# Patient Record
Sex: Female | Born: 1945
Health system: Southern US, Community
[De-identification: ages and names within clinical notes are randomized; demographics above are authoritative.]

## PROBLEM LIST (undated history)

## (undated) DIAGNOSIS — Z8619 Personal history of other infectious and parasitic diseases: Secondary | ICD-10-CM

## (undated) DIAGNOSIS — I5020 Unspecified systolic (congestive) heart failure: Secondary | ICD-10-CM

## (undated) DIAGNOSIS — M1990 Unspecified osteoarthritis, unspecified site: Secondary | ICD-10-CM

## (undated) DIAGNOSIS — M8580 Other specified disorders of bone density and structure, unspecified site: Secondary | ICD-10-CM

## (undated) DIAGNOSIS — Z8759 Personal history of other complications of pregnancy, childbirth and the puerperium: Secondary | ICD-10-CM

## (undated) DIAGNOSIS — L0100 Impetigo, unspecified: Secondary | ICD-10-CM

## (undated) DIAGNOSIS — R030 Elevated blood-pressure reading, without diagnosis of hypertension: Secondary | ICD-10-CM

## (undated) HISTORY — DX: Impetigo, unspecified: L01.00

## (undated) HISTORY — DX: Personal history of other infectious and parasitic diseases: Z86.19

## (undated) HISTORY — DX: Elevated blood-pressure reading, without diagnosis of hypertension: R03.0

## (undated) HISTORY — DX: Unspecified osteoarthritis, unspecified site: M19.90

## (undated) HISTORY — DX: Unspecified systolic (congestive) heart failure: I50.20

## (undated) HISTORY — DX: Personal history of other complications of pregnancy, childbirth and the puerperium: Z87.59

## (undated) HISTORY — DX: Other specified disorders of bone density and structure, unspecified site: M85.80

---
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
## 2013-03-28 ENCOUNTER — Encounter: Payer: Self-pay | Admitting: Obstetrics and Gynecology

## 2013-04-12 ENCOUNTER — Encounter: Payer: Self-pay | Admitting: Obstetrics and Gynecology

## 2013-04-15 ENCOUNTER — Encounter: Payer: Self-pay | Admitting: Gynecology

## 2014-05-30 ENCOUNTER — Encounter: Payer: Self-pay | Admitting: Certified Nurse Midwife

## 2014-07-14 ENCOUNTER — Encounter: Payer: Self-pay | Admitting: Nurse Practitioner

## 2014-11-21 ENCOUNTER — Ambulatory Visit (INDEPENDENT_AMBULATORY_CARE_PROVIDER_SITE_OTHER): Payer: 59 | Admitting: Nurse Practitioner

## 2014-11-21 ENCOUNTER — Encounter: Payer: Self-pay | Admitting: Nurse Practitioner

## 2014-11-21 VITALS — BP 160/92 | HR 88 | Temp 98.0°F | Ht 64.0 in | Wt 183.0 lb

## 2014-11-21 DIAGNOSIS — Z1211 Encounter for screening for malignant neoplasm of colon: Secondary | ICD-10-CM

## 2014-11-21 DIAGNOSIS — IMO0001 Reserved for inherently not codable concepts without codable children: Secondary | ICD-10-CM

## 2014-11-21 DIAGNOSIS — Z1239 Encounter for other screening for malignant neoplasm of breast: Secondary | ICD-10-CM

## 2014-11-21 DIAGNOSIS — Z23 Encounter for immunization: Secondary | ICD-10-CM

## 2014-11-21 DIAGNOSIS — L0100 Impetigo, unspecified: Secondary | ICD-10-CM

## 2014-11-21 DIAGNOSIS — R030 Elevated blood-pressure reading, without diagnosis of hypertension: Secondary | ICD-10-CM

## 2014-11-21 MED ORDER — AMOXICILLIN-POT CLAVULANATE 875-125 MG PO TABS: 1.0000 | ORAL_TABLET | Freq: Two times a day (BID) | ORAL | Status: DC

## 2014-11-21 MED ORDER — MUPIROCIN 2 % EX OINT: 1.0000 "application " | TOPICAL_OINTMENT | Freq: Two times a day (BID) | CUTANEOUS | Status: DC

## 2014-11-21 NOTE — Patient Instructions (Signed)
Start oral antibiotic. Take after eating. Eat yogurt daily to help prevent antibiotic -associated diarrhea. Use mupirocin cream twice daily over skin lesions. Wash with [employer] twice daily.  Apply warm wash cloth (as hot as you can stand) 3 times daily. Return in 1 week to evaluate response. We can do Pap at same time if convenient.      Cellulitis Cellulitis is an infection of the skin and the tissue beneath it. The infected area is usually red and tender. Cellulitis occurs most often in the arms and lower legs.  CAUSES  Cellulitis is caused by bacteria that enter the skin through cracks or cuts in the skin. The most common types of bacteria that cause cellulitis are staphylococci and streptococci. SIGNS AND SYMPTOMS   Redness and warmth.  Swelling.  Tenderness or pain.  Fever. DIAGNOSIS  Your health care provider can usually determine what is wrong based on a physical exam. Blood tests may also be done. TREATMENT  Treatment usually involves taking an antibiotic medicine. HOME CARE INSTRUCTIONS   Take your antibiotic medicine as directed by your health care provider. Finish the antibiotic even if you start to feel better.  Keep the infected arm or leg elevated to reduce swelling.  Apply a warm cloth to the affected area up to 4 times per day to relieve pain.  Take medicines only as directed by your health care provider.  Keep all follow-up visits as directed by your health care provider. SEEK MEDICAL CARE IF:   You notice red streaks coming from the infected area.  Your red area gets larger or turns dark in color.  Your bone or joint underneath the infected area becomes painful after the skin has healed.  Your infection returns in the same area or another area.  You notice a swollen bump in the infected area.  You develop new symptoms.  You have a fever. SEEK IMMEDIATE MEDICAL CARE IF:   You feel very sleepy.  You develop vomiting or diarrhea.  You have a  general ill feeling (malaise) with muscle aches and pains. MAKE SURE YOU:   Understand these instructions.  Will watch your condition.  Will get help right away if you are not doing well or get worse. Document Released: 06/11/2005 Document Revised: 01/16/2014 Document Reviewed: 11/17/2011 [facility] Patient Information 2015 [place], [place]. This information is not intended to replace advice given to you by your health care provider. Make sure you discuss any questions you have with your health care provider.

## 2014-11-21 NOTE — Progress Notes (Signed)
Pre visit review using our clinic review tool, if applicable. No additional management support is needed unless otherwise documented below in the visit note. 

## 2014-11-27 ENCOUNTER — Encounter: Payer: Self-pay | Admitting: Nurse Practitioner

## 2014-11-27 DIAGNOSIS — I10 Essential (primary) hypertension: Secondary | ICD-10-CM | POA: Insufficient documentation

## 2014-11-27 DIAGNOSIS — IMO0001 Reserved for inherently not codable concepts without codable children: Secondary | ICD-10-CM

## 2014-11-27 DIAGNOSIS — R030 Elevated blood-pressure reading, without diagnosis of hypertension: Secondary | ICD-10-CM

## 2014-11-27 DIAGNOSIS — L0100 Impetigo, unspecified: Secondary | ICD-10-CM | POA: Insufficient documentation

## 2014-11-27 DIAGNOSIS — Z1239 Encounter for other screening for malignant neoplasm of breast: Secondary | ICD-10-CM | POA: Insufficient documentation

## 2014-11-27 DIAGNOSIS — Z1211 Encounter for screening for malignant neoplasm of colon: Secondary | ICD-10-CM | POA: Insufficient documentation

## 2014-11-27 HISTORY — DX: Reserved for inherently not codable concepts without codable children: IMO0001

## 2014-11-27 HISTORY — DX: Impetigo, unspecified: L01.00

## 2014-11-27 NOTE — Progress Notes (Signed)
Subjective:     Erica Liu is a 69 y.o. female and is here for a comprehensive physical exam. She is accompanied by her daughter today. The patient reports problems - arthritis in knees, abrasion on face, and has elevated blood pressure on exam today.  Also c/o fatigue. Knees: duration-many months. Pain worse w/stairs. Pain improved since taking turmeric. Denies warmth, swelling, erythema. Face: onset-1 week ago. Incident-"scrubbed" skin over brow to get make-up off. Few days later, area became red, tender. Now has swelling of face & spreading rash. Denies fever, drainage of wound. BP: No Hx of high bp. RF: age, obese. 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 Fatigue: Comes home from work exhausted. Denies fever, chills, nightsweats, frequent infections. No PCP in " years" . [facility]. Recalls Pap was nml, no hX of abnmls. Went through menopause 10 yrs ago. Last MMG 5 to 10 yrs ago-nml.  History   Social History  . Marital Status: Married    Spouse Name: N/A  . Number of Children: 4  . Years of Education: N/A   Occupational History  .      teacher asst   Social History Main Topics  . Smoking status: Former Smoker -- 0.50 packs/day for 15 years    Quit date: 11/20/1988  . Smokeless tobacco: Not on file  . Alcohol Use: 0.0 oz/week    0 Standard drinks or equivalent per week     Comment: occasional  . Drug Use: No  . Sexual Activity: Yes    Birth Control/ Protection: Post-menopausal   Other Topics Concern  . Not on file   Social History Narrative   [name] is from [place]. She is divorced & remarried. Lives w/her husband, 2 grandsons, & daughter. 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 DEXA SCAN  05/30/2015 (Originally 02/04/2012)  . ZOSTAVAX  05/30/2015 (Originally 02/04/2007)  . COLONOSCOPY  11/27/2015 (Originally 02/03/1997)  . INFLUENZA VACCINE  04/16/2015  .  PNA vac Low Risk Adult (2 of 2 - PCV13) 11/21/2015  . MAMMOGRAM  11/26/2016  . TETANUS/TDAP  11/20/2024    The following portions of the patient's history were reviewed and updated as appropriate: allergies, current medications, past family history, past medical history, past social history, past surgical history and problem list.  Review of Systems Eyes: negative for irritation and visual disturbance Respiratory: negative for cough and dyspnea on exertion Cardiovascular: negative for lower extremity edema and palpitations Gastrointestinal: negative for abdominal pain, change in bowel habits, constipation, diarrhea and reflux symptoms Neurological: negative for headaches Behavioral/Psych: negative for anxiety, bad mood, excessive alcohol consumption, illegal drug usage, sleep disturbance and tobacco use   Objective:    BP 160/92 mmHg  Pulse 88  Temp(Src) 98 F (36.7 C) (Temporal)  Ht  (1.626 m)  Wt 183 lb (83.008 kg)  BMI 31.40 kg/m2  SpO2 98% General appearance: alert, cooperative, appears stated age and no distress Head: Normocephalic, without obvious abnormality, atraumatic Eyes: negative findings: lids and lashes normal, conjunctivae and sclerae normal, corneas clear and pupils equal, round, reactive to light and accomodation Ears: normal TM's and external ear canals both ears Neck: no adenopathy, no carotid bruit, supple, symmetrical, trachea midline and thyroid not enlarged, symmetric, no tenderness/mass/nodules Lungs: clear to auscultation bilaterally Heart: regular rate and rhythm, S1, S2 normal, no murmur, click, rub or gallop Abdomen: soft, non-tender; bowel sounds normal; no  masses,  no organomegaly Extremities: extremities normal, atraumatic, no cyanosis or edema Pulses: 2+ and symmetric Skin: Mild periorbital edema R eye, yellow exudate in brow, abrasion & erythema along brow & adjcent to brow-lat face. Tender to palpate Lymph nodes: Cervical adenopathy: none,  Supraclavicular adenopathy: none and no preauricular or postauricular LAD Neurologic: Grossly normal    Assessment:Plan  1. Breast cancer screening - MM DIGITAL SCREENING BILATERAL; Future  2. Colon cancer screening Pt declines colonoscopy - Fecal occult blood, imunochemical  3. Impetigo, new See pt instructions for skin care - mupirocin ointment (BACTROBAN) 2 %; Place 1 application into the nose 2 (two) times daily.  Dispense: 22 g; Refill: 0 - amoxicillin-clavulanate (AUGMENTIN) 875-125 MG per tablet; Take 1 tablet by mouth 2 (two) times daily.  Dispense: 20 tablet; Refill: 0  4. Need for 23-polyvalent pneumococcal polysaccharide vaccine - Pneumococcal polysaccharide vaccine 23-valent greater than or equal to 2yo subcutaneous/IM  5. Need for Tdap vaccination - Tdap vaccine greater than or equal to 7yo IM  6. Elevated blood pressure, new Weight loss Exercise  F/u 1 weeks-impetigo, CPE, PAP

## 2014-11-29 ENCOUNTER — Telehealth: Payer: Self-pay | Admitting: Nurse Practitioner

## 2014-11-29 ENCOUNTER — Ambulatory Visit: Payer: 59 | Admitting: Nurse Practitioner

## 2014-11-29 NOTE — Telephone Encounter (Signed)
Patient is feeling better. She would like to get a shingles vaccine. Can a Rx be mailed to her house?

## 2014-11-29 NOTE — Telephone Encounter (Signed)
Please Advise

## 2014-11-29 NOTE — Telephone Encounter (Signed)
Yes. I will write script. Please mail. pls call her & advise she can take [facility] to [facility]. She should be well when she gets vaccine-no sniffles or fever.

## 2014-11-30 ENCOUNTER — Ambulatory Visit: Payer: 59 | Admitting: Nurse Practitioner

## 2014-11-30 NOTE — Telephone Encounter (Signed)
LMOVM for patient to return call.

## 2014-11-30 NOTE — Telephone Encounter (Signed)
Patient called back and I informed her of Erica Liu's message.

## 2014-12-07 ENCOUNTER — Ambulatory Visit: Payer: 59 | Admitting: Nurse Practitioner

## 2014-12-07 ENCOUNTER — Ambulatory Visit: Payer: 59 | Admitting: Psychology

## 2014-12-08 ENCOUNTER — Ambulatory Visit (HOSPITAL_BASED_OUTPATIENT_CLINIC_OR_DEPARTMENT_OTHER)
Admission: RE | Admit: 2014-12-08 | Discharge: 2014-12-08 | Disposition: A | Discharge status: Home / Self Care | Payer: 59 | Source: Ambulatory Visit | Attending: Nurse Practitioner | Admitting: Nurse Practitioner

## 2014-12-08 DIAGNOSIS — Z1231 Encounter for screening mammogram for malignant neoplasm of breast: Secondary | ICD-10-CM | POA: Insufficient documentation

## 2014-12-08 DIAGNOSIS — Z1239 Encounter for other screening for malignant neoplasm of breast: Secondary | ICD-10-CM

## 2014-12-15 ENCOUNTER — Ambulatory Visit (INDEPENDENT_AMBULATORY_CARE_PROVIDER_SITE_OTHER): Payer: PRIVATE HEALTH INSURANCE | Admitting: Psychology

## 2014-12-15 ENCOUNTER — Encounter: Payer: 59 | Admitting: Nurse Practitioner

## 2014-12-15 DIAGNOSIS — F40228 Other natural environment type phobia: Secondary | ICD-10-CM

## 2014-12-15 DIAGNOSIS — F40248 Other situational type phobia: Secondary | ICD-10-CM | POA: Diagnosis not present

## 2014-12-21 ENCOUNTER — Ambulatory Visit (INDEPENDENT_AMBULATORY_CARE_PROVIDER_SITE_OTHER): Payer: PRIVATE HEALTH INSURANCE | Admitting: Psychology

## 2014-12-21 DIAGNOSIS — F40228 Other natural environment type phobia: Secondary | ICD-10-CM | POA: Diagnosis not present

## 2014-12-21 DIAGNOSIS — F40248 Other situational type phobia: Secondary | ICD-10-CM | POA: Diagnosis not present

## 2014-12-28 ENCOUNTER — Ambulatory Visit: Payer: 59 | Admitting: Psychology

## 2015-01-04 ENCOUNTER — Ambulatory Visit: Payer: PRIVATE HEALTH INSURANCE | Admitting: Psychology

## 2015-01-04 ENCOUNTER — Ambulatory Visit (INDEPENDENT_AMBULATORY_CARE_PROVIDER_SITE_OTHER): Payer: PRIVATE HEALTH INSURANCE | Admitting: Psychology

## 2015-01-04 DIAGNOSIS — F40248 Other situational type phobia: Secondary | ICD-10-CM | POA: Diagnosis not present

## 2015-01-04 DIAGNOSIS — F40228 Other natural environment type phobia: Secondary | ICD-10-CM | POA: Diagnosis not present

## 2015-01-05 ENCOUNTER — Encounter: Payer: 59 | Admitting: Nurse Practitioner

## 2015-01-17 ENCOUNTER — Ambulatory Visit: Payer: 59 | Admitting: Psychology

## 2016-08-06 ENCOUNTER — Telehealth: Payer: Self-pay | Admitting: *Deleted

## 2016-08-06 NOTE — Telephone Encounter (Signed)
Left message for pt to call back. So we can update her chart. New PCP? Or is she planing on seeing Dr. McGowen or Dr. Kuneff.   

## 2016-08-15 NOTE — Telephone Encounter (Signed)
Left message for pt to call back  °

## 2016-08-15 NOTE — Telephone Encounter (Signed)
Spoke to pt and she stated that she is going to call after the holidays and schedule an apt with Dr. Claiborne Billings.

## 2018-05-31 ENCOUNTER — Ambulatory Visit (INDEPENDENT_AMBULATORY_CARE_PROVIDER_SITE_OTHER): Payer: 59

## 2018-05-31 ENCOUNTER — Encounter: Payer: Self-pay | Admitting: Family Medicine

## 2018-05-31 ENCOUNTER — Ambulatory Visit (INDEPENDENT_AMBULATORY_CARE_PROVIDER_SITE_OTHER): Payer: 59 | Admitting: Family Medicine

## 2018-05-31 VITALS — BP 132/78 | HR 80 | Temp 97.8°F | Ht 65.5 in | Wt 172.8 lb

## 2018-05-31 DIAGNOSIS — Z0001 Encounter for general adult medical examination with abnormal findings: Secondary | ICD-10-CM | POA: Diagnosis not present

## 2018-05-31 DIAGNOSIS — M25512 Pain in left shoulder: Secondary | ICD-10-CM

## 2018-05-31 DIAGNOSIS — Z23 Encounter for immunization: Secondary | ICD-10-CM | POA: Diagnosis not present

## 2018-05-31 DIAGNOSIS — G8929 Other chronic pain: Secondary | ICD-10-CM

## 2018-05-31 DIAGNOSIS — L719 Rosacea, unspecified: Secondary | ICD-10-CM

## 2018-05-31 DIAGNOSIS — J339 Nasal polyp, unspecified: Secondary | ICD-10-CM | POA: Diagnosis not present

## 2018-05-31 DIAGNOSIS — M25561 Pain in right knee: Secondary | ICD-10-CM

## 2018-05-31 DIAGNOSIS — R011 Cardiac murmur, unspecified: Secondary | ICD-10-CM

## 2018-05-31 DIAGNOSIS — Z0000 Encounter for general adult medical examination without abnormal findings: Secondary | ICD-10-CM

## 2018-05-31 DIAGNOSIS — M25562 Pain in left knee: Secondary | ICD-10-CM | POA: Diagnosis not present

## 2018-05-31 DIAGNOSIS — N959 Unspecified menopausal and perimenopausal disorder: Secondary | ICD-10-CM

## 2018-05-31 DIAGNOSIS — Z1211 Encounter for screening for malignant neoplasm of colon: Secondary | ICD-10-CM

## 2018-05-31 DIAGNOSIS — Z1159 Encounter for screening for other viral diseases: Secondary | ICD-10-CM

## 2018-05-31 LAB — CBC WITH DIFFERENTIAL/PLATELET
BASOS ABS: 0.1 10*3/uL (ref 0.0–0.1)
Basophils Relative: 0.9 % (ref 0.0–3.0)
Eosinophils Absolute: 0.4 10*3/uL (ref 0.0–0.7)
Eosinophils Relative: 5.5 % — ABNORMAL HIGH (ref 0.0–5.0)
HCT: 39.2 % (ref 36.0–46.0)
Hemoglobin: 13.2 g/dL (ref 12.0–15.0)
LYMPHS ABS: 3 10*3/uL (ref 0.7–4.0)
Lymphocytes Relative: 40.8 % (ref 12.0–46.0)
MCHC: 33.6 g/dL (ref 30.0–36.0)
MCV: 90.7 fl (ref 78.0–100.0)
MONO ABS: 0.5 10*3/uL (ref 0.1–1.0)
Monocytes Relative: 7.5 % (ref 3.0–12.0)
NEUTROS PCT: 45.3 % (ref 43.0–77.0)
Neutro Abs: 3.3 10*3/uL (ref 1.4–7.7)
Platelets: 282 10*3/uL (ref 150.0–400.0)
RBC: 4.32 Mil/uL (ref 3.87–5.11)
RDW: 13 % (ref 11.5–15.5)
WBC: 7.3 10*3/uL (ref 4.0–10.5)

## 2018-05-31 LAB — LIPID PANEL
Cholesterol: 240 mg/dL — ABNORMAL HIGH (ref 0–200)
HDL: 55.6 mg/dL (ref >39.00)
LDL Cholesterol: 159 mg/dL — ABNORMAL HIGH (ref 0–99)
NonHDL: 184.4
Total CHOL/HDL Ratio: 4
Triglycerides: 129 mg/dL (ref 0.0–149.0)
VLDL: 25.8 mg/dL (ref 0.0–40.0)

## 2018-05-31 LAB — COMPREHENSIVE METABOLIC PANEL
ALK PHOS: 74 U/L (ref 39–117)
ALT: 17 U/L (ref 0–35)
AST: 22 U/L (ref 0–37)
Albumin: 4.5 g/dL (ref 3.5–5.2)
BILIRUBIN TOTAL: 0.6 mg/dL (ref 0.2–1.2)
BUN: 14 mg/dL (ref 6–23)
CO2: 30 mEq/L (ref 19–32)
Calcium: 9.6 mg/dL (ref 8.4–10.5)
Chloride: 105 mEq/L (ref 96–112)
Creatinine, Ser: 0.75 mg/dL (ref 0.40–1.20)
GFR: 80.89 mL/min (ref >60.00)
Glucose, Bld: 92 mg/dL (ref 70–99)
Potassium: 4.2 mEq/L (ref 3.5–5.1)
SODIUM: 140 meq/L (ref 135–145)
TOTAL PROTEIN: 7.3 g/dL (ref 6.0–8.3)

## 2018-05-31 LAB — TSH: TSH: 2.32 u[IU]/mL (ref 0.35–4.50)

## 2018-05-31 MED ORDER — MOMETASONE FUROATE 50 MCG/ACT NA SUSP: 2.0000 | Freq: Every day | NASAL | 12 refills | Status: DC

## 2018-05-31 MED ORDER — AZELAIC ACID 15 % EX GEL: CUTANEOUS | 1 refills | Status: DC

## 2018-05-31 MED ORDER — DICLOFENAC SODIUM 1 % TD GEL: TRANSDERMAL | 3 refills | Status: DC

## 2018-05-31 MED ORDER — CYCLOBENZAPRINE HCL 10 MG PO TABS: 10.0000 mg | ORAL_TABLET | Freq: Three times a day (TID) | ORAL | 0 refills | Status: DC | PRN

## 2018-05-31 NOTE — Progress Notes (Deleted)
Phone: [phone]  Subjective:  Patient presents today for their Medicare Exam    Preventive Screening-Counseling & Management   No exam data present  Advanced directives: ***  Smoking Status: ***Never Smoker Second Hand Smoking status: ***No smokers in home  Risk Factors Regular exercise: *** Diet: ***  Fall Risk: ***None  Fall Risk  05/31/2018 05/31/2018  Falls in the past year? No No   Opioid use history: *** no long term opioids use  Cardiac risk factors:  advanced age (older than 86 for men, 23 for women) *** Hyperlipidemia *** No diabetes. *** Family History: ***   Depression Screen None. PHQ2 0 *** Depression screen PHQ 2/9 05/31/2018  Decreased Interest 0  Down, Depressed, Hopeless 0  PHQ - 2 Score 0    Activities of Daily Living Independent ADLs and IADLs ***  Hearing Difficulties: ***-patient declines  Cognitive Testing No reported trouble.  ***  Normal 3 word recall  List the Names of Other Physician/Practitioners you currently use: -*** -***  Immunization History  Administered Date(s) Administered  . Pneumococcal Polysaccharide-23 11/21/2014  . Tdap 11/21/2014   Required Immunizations needed today ***  Screening tests- up to date Health Maintenance Due  Topic Date Due  . Hepatitis C Screening  03-Apr-1946  . COLONOSCOPY  02/03/1997  . DEXA SCAN  02/04/2012  . PNA vac Low Risk Adult (2 of 2 - PCV13) 11/21/2015  . MAMMOGRAM  12/07/2016  . INFLUENZA VACCINE  04/15/2018    ROS- No pertinent positives discovered in course of AWV  The following were reviewed and entered/updated in epic: Past Medical History:  Diagnosis Date  . Arthritis    knee?  . H/O miscarriage, not currently pregnant    2   Patient Active Problem List   Diagnosis Date Noted  . Breast cancer screening 11/27/2014  . Colon cancer screening 11/27/2014  . Impetigo 11/27/2014  . Elevated blood pressure 11/27/2014   Past Surgical History:  Procedure  Laterality Date  . TONSILLECTOMY  1953    Family History  Problem Relation Age of Onset  . Heart disease Mother 102  . Hypertension Mother   . Heart disease Father        mi  . Diabetes Sister   . Arthritis Daughter        RA  . Alcohol abuse Son     Medications- reviewed and updated No current outpatient medications on file.   No current facility-administered medications for this visit.     Allergies-reviewed and updated Allergies  Allergen Reactions  . Neomycin Anaphylaxis    Social History   Socioeconomic History  . Marital status: Married    Spouse name: Not on file  . Number of children: 4  . Years of education: Not on file  . Highest education level: Not on file  Occupational History    Comment: teacher asst  Social Needs  . Financial resource strain: Not on file  . Food insecurity:    Worry: Not on file    Inability: Not on file  . Transportation needs:    Medical: Not on file    Non-medical: Not on file  Tobacco Use  . Smoking status: Former Smoker    Packs/day: 0.50    Years: 15.00    Pack years: 7.50    Last attempt to quit: 11/20/1988    Years since quitting: 29.5  . Smokeless tobacco: Never Used  Substance and Sexual Activity  . Alcohol use: Yes    Alcohol/week: 0.0  standard drinks    Comment: occasional  . Drug use: No  . Sexual activity: Yes    Birth control/protection: Post-menopausal  Lifestyle  . Physical activity:    Days per week: Not on file    Minutes per session: Not on file  . Stress: Not on file  Relationships  . Social connections:    Talks on phone: Not on file    Gets together: Not on file    Attends religious service: Not on file    Active member of club or organization: Not on file    Attends meetings of clubs or organizations: Not on file    Relationship status: Not on file  Other Topics Concern  . Not on file  Social History Narrative   Ms. Ellery is from [place]. She is divorced & remarried. Lives w/her husband, 2  grandsons, & daughter. 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 Oropharynx normal Neck: no thyromegaly CV: RRR no murmurs rubs or gallops Lungs: CTAB no crackles, wheeze, rhonchi Abdomen: soft/nontender/nondistended/normal bowel sounds. No rebound or guarding.  Ext: no edema Skin: warm, dry Neuro: grossly normal, moves all extremities, PERRLA  Assessment/Plan:  Welcome to Medicare exam completed- discussed recommended screenings anddocumented any personalized health advice and referrals for preventive counseling. See AVS as well which was given to patient.   Status of chronic or acute concerns  ***  No problem-specific Assessment & Plan notes found for this encounter.   No future appointments. No follow-ups on file.   Lab/Order associations: Encounter for hepatitis C screening test for low risk patient - Plan: Hepatitis C antibody  Annual physical exam - Plan: CBC with Differential/Platelet, Comprehensive metabolic panel, Lipid panel, TSH  No orders of the defined types were placed in this encounter.   Return precautions advised. Orland Mustard, MD

## 2018-05-31 NOTE — Patient Instructions (Signed)
Health Maintenance Due  Topic Date Due  . Hepatitis C Screening -ordered today 04-Dec-1945  . COLONOSCOPY -pt refuses 02/03/1997  . DEXA SCAN -referral placed today 02/04/2012  . PNA vac Low Risk Adult (2 of 2 - PCV13)-done today 11/21/2015  . MAMMOGRAM -referral placed today 12/07/2016  . INFLUENZA VACCINE -please call our office to schedule this in October/November 04/15/2018

## 2018-05-31 NOTE — Progress Notes (Addendum)
Patient: Erica Liu MRN: [id number] DOB: Oct 20, 1945 PCP: Orland Mustard, MD     Subjective:  Chief Complaint  Patient presents with  . Establish Care    requesting cpe    HPI: The patient is a 72 y.o. female who presents today for annual exam. She denies any changes to past medical history. There have been no recent hospitalizations. They are following a well balanced diet and exercise plan. Weight has been stable. FH of massive MI in her father at age 85 and was chronic/excessive smoker. Mother had CAD and lived to 50 years. She had sarcoma and this is what killed her.   She fell a couple of years ago and has had chronic pain in her left shoulder since that time. She is right handed. She fell onto her outstretched left arm. Pain is intermittent. She has a lump on this shoulder. She does not want any surgery. ROM is fully intact.   She picked up some flower pots in her house and felt some pain in her back. She has a history of back spasms and is starting to have them again. She has not had any medication in 20 years. She is thinking about going to PT as well. Located on upper neck, right side.   Varicose veins: she has some veins/lumps that she can feel behind both of her knees. They have been bothering her only at night. They don't hurt to walk at all.   Chronic knee pain bilaterally: has had knee pain for "years." she thinks she has arthritis. Doesn't really take otc medication. Denies any erythema. Left knee will occasionally have some edema on superior aspect of patella. She has full ROM bilaterally.   Rash on face: She has a red rash on her face. She thinks she may have rosacea. Worse  When she is hot/sweaty/spicy foods. Has not tried anything over the counter and not seen derm for this.   Polyps in nose: has hx of polyps in the nose. Wondering if she has another one. No sob/difficulty breathing through her nose and does not snore. Not using any steroid nasal spray.   Immunization  History  Administered Date(s) Administered  . Pneumococcal Conjugate-13 05/31/2018  . Pneumococcal Polysaccharide-23 11/21/2014  . Tdap 11/21/2014   Colonoscopy: never had one and doesn't want to do this. Interested in other testing  Mammogram: 2016.  Pap smear: over 17 years.  Pneumonia shot today (pcv-13) utd on tetanus/pnuemovax 23.    Review of Systems  Constitutional: Negative for chills, fatigue and fever.  HENT: Negative for dental problem, ear pain, hearing loss and trouble swallowing.   Eyes: Negative for visual disturbance.  Respiratory: Negative for cough, chest tightness and shortness of breath.   Cardiovascular: Negative for chest pain, palpitations and leg swelling.  Gastrointestinal: Negative for abdominal pain, blood in stool, diarrhea and nausea.  Endocrine: Negative for cold intolerance, polydipsia, polyphagia and polyuria.  Genitourinary: Negative for dysuria and hematuria.  Musculoskeletal: Positive for arthralgias.  Skin: Negative for rash.  Neurological: Negative for dizziness and headaches.  Psychiatric/Behavioral: Negative for dysphoric mood and sleep disturbance. The patient is not nervous/anxious.     Allergies Patient is allergic to neomycin.  Past Medical History Patient  has a past medical history of Arthritis, H/O miscarriage, not currently pregnant, and History of chicken pox.  Surgical History Patient  has a past surgical history that includes Tonsillectomy (1953).  Family History Pateint's family history includes Alcohol abuse in her son; Arthritis in her daughter; Cancer  in her maternal grandmother and mother; Diabetes in her sister; Early death in her paternal grandmother; Heart attack in her father and maternal grandfather; Heart disease in her father; Heart disease (age of onset: 69) in her mother; Hyperlipidemia in her mother; Hypertension in her mother.  Social History Patient  reports that she quit smoking about 29 years ago. She has a  7.50 pack-year smoking history. She has never used smokeless tobacco. She reports that she drinks alcohol. She reports that she does not use drugs.    Objective: Vitals:   05/31/18 1021 05/31/18 1123  BP: (!) 158/86 132/78  Pulse: 80   Temp: 97.8 F (36.6 C)   TempSrc: Oral   SpO2: 97%   Weight: 172 lb 12.8 oz (78.4 kg)   Height: 5' 5.5" (1.664 m)     Body mass index is 28.32 kg/m.  Physical Exam  Constitutional: She is oriented to person, place, and time.  HENT:  Right Ear: External ear normal.  Left Ear: External ear normal.  Mouth/Throat: Oropharynx is clear and moist.  Eyes: Pupils are equal, round, and reactive to light. EOM are normal.  Neck: Normal range of motion. Neck supple. No thyromegaly present.  Cardiovascular: Normal rate and regular rhythm.  Murmur heard. Pulmonary/Chest: Effort normal and breath sounds normal.  Abdominal: Soft. Bowel sounds are normal.  Musculoskeletal: Normal range of motion. She exhibits no edema, tenderness or deformity.  Left shoulder-normal neer's test/passive arc/speeds test. Pain with gerber. No pain in [facility] joint.   Bilateral knee exam: + crepitus, otherwise normal.  ? Baker's cyst popliteal fossas.   ttp over right trapezius.   Lymphadenopathy:    She has no cervical adenopathy.  Neurological: She is alert and oriented to person, place, and time. No cranial nerve deficit.  Skin: No rash noted.  SK on back/chest/abdomen  Erythema and papules over Tzone on face   Psychiatric: She has a normal mood and affect. Her behavior is normal.       Assessment/plan: 1. Annual physical exam Routine lab work today. Getting her health maintenance all up to date. She has a lot of issues. Dicussed diet/weight loss and she will come back for her pap smear since has been so long since she has had this done.  Patient counseling [x]    Nutrition: Stressed importance of moderation in sodium/caffeine intake, saturated fat and cholesterol, caloric  balance, sufficient intake of fresh fruits, vegetables, fiber, calcium, iron, and 1 mg of folate supplement per day (for females capable of pregnancy).  [x]    Stressed the importance of regular exercise.   [x]    Substance Abuse: Discussed cessation/primary prevention of tobacco, alcohol, or other drug use; driving or other dangerous activities under the influence; availability of treatment for abuse.   [x]    Injury prevention: Discussed safety belts, safety helmets, smoke detector, smoking near bedding or upholstery.   [x]    Sexuality: Discussed sexually transmitted diseases, partner selection, use of condoms, avoidance of unintended pregnancy  and contraceptive alternatives.  [x]    Dental health: Discussed importance of regular tooth brushing, flossing, and dental visits.  [x]    Health maintenance and immunizations reviewed. Please refer to Health maintenance section.    - CBC with Differential/Platelet - Comprehensive metabolic panel - Lipid panel - TSH  2. Encounter for hepatitis C screening test for low risk patient  - Hepatitis C antibody  3. Chronic left shoulder pain Exam normal. Significant OA. May benefit from an injection. Not interested in anything invasive.  -  DG Shoulder Left; Future  4. Colon cancer screening Declines cscope, but is open to cologuard. Ordered for her today and discussed to call to make sure insurance covers before doing test. Information given to her.  - Cologuard  5. Chronic pain of both knees Severe OA bilaterally. Worse on the left. She is not really keen on surgery right now. They are going to discuss injections and when we call with results let me know if they are okay with sports med vs. Ortho. Weight loss would be very beneficial for her and can do trial of tumeric. Will also send in volatren gel since doesn't like taking daily NSAIDs.  - DG Knee 1-2 Views Left; Future  6. Need for prophylactic vaccination against Streptococcus pneumoniae  (pneumococcus)  - Pneumococcal conjugate vaccine 13-valent  7. Murmur New murmur. Slightly harsh/aortic. Ordering echo on her  - ECHOCARDIOGRAM COMPLETE; Future  8. Nasal polyp No polyp visualized. Has past history of this. Will do trial of nasal steroid spray and if not better she is to let me know so I can send her to ENT.  - mometasone (NASONEX) 50 MCG/ACT nasal spray; Place 2 sprays into the nose daily.  Dispense: 17 g; Refill: 12  9. Rosacea Trial of finascea and changing lifestyle. Handout given on triggers/things to avoid. Will also send to derm as she may benefit from laser therapy.  - Ambulatory referral to Dermatology    Return in about 6 months (around 11/29/2018) for pap.   >45 minutes spent in management/counseling/coordination of care of multiple acute issues.   Orland Mustard, MD [facility]  05/31/2018

## 2018-06-01 LAB — HEPATITIS C ANTIBODY
Hepatitis C Ab: NONREACTIVE
SIGNAL TO CUT-OFF: 0.05 (ref <1.00)

## 2018-06-03 ENCOUNTER — Encounter: Payer: Self-pay | Admitting: Family Medicine

## 2018-06-03 ENCOUNTER — Other Ambulatory Visit: Payer: Self-pay | Admitting: Family Medicine

## 2018-06-03 DIAGNOSIS — M170 Bilateral primary osteoarthritis of knee: Secondary | ICD-10-CM

## 2018-06-03 MED ORDER — ROSUVASTATIN CALCIUM 10 MG PO TABS: 10.0000 mg | ORAL_TABLET | Freq: Every day | ORAL | 3 refills | Status: DC

## 2018-06-16 ENCOUNTER — Telehealth: Payer: Self-pay | Admitting: Family Medicine

## 2018-06-16 ENCOUNTER — Encounter: Payer: Self-pay | Admitting: Family Medicine

## 2018-06-16 ENCOUNTER — Other Ambulatory Visit: Payer: Self-pay

## 2018-06-16 DIAGNOSIS — R011 Cardiac murmur, unspecified: Secondary | ICD-10-CM

## 2018-06-16 DIAGNOSIS — Z780 Asymptomatic menopausal state: Secondary | ICD-10-CM

## 2018-06-16 NOTE — Telephone Encounter (Signed)
Ordered dexa and echo on this patient on 05/31/18. She hasn't heard about any of this. Please f/u.  Thanks!

## 2018-06-17 NOTE — Telephone Encounter (Signed)
Called patient and she stated that she wanted to get scheduled at [facility] for the Echo and the Dexa on the same day. I explained that I only have access to schedule the Bone Density for [facility]. She would liek all orders to go to [facility].

## 2018-06-17 NOTE — Telephone Encounter (Signed)
See note. Please call and schedule DEXA. I have [facility] order to [facility] for E[facility].

## 2018-06-18 ENCOUNTER — Other Ambulatory Visit: Payer: Self-pay | Admitting: Family Medicine

## 2018-06-18 DIAGNOSIS — Z1231 Encounter for screening mammogram for malignant neoplasm of breast: Secondary | ICD-10-CM

## 2018-06-18 NOTE — Telephone Encounter (Signed)
I have routed them both to [facility]. The patient can call [phone] to schedule those on a date convenient for her schedule.

## 2018-06-18 NOTE — Telephone Encounter (Signed)
Called and LVM for the patient to call the number provided.

## 2018-06-21 ENCOUNTER — Telehealth: Payer: Self-pay | Admitting: Family Medicine

## 2018-06-21 NOTE — Telephone Encounter (Signed)
Spoke with [employer].  Patient was already seen for appt, no xray pictures were needed.

## 2018-06-21 NOTE — Telephone Encounter (Signed)
See note.   Copied from CRM [id number]. Topic: General - Other >> Jun 21, 2018 11:44 AM Marylen Ponto wrote: Reason for CRM: Pt daughter states they are at [facility] for the appt and the x-ray pictures are needed. She said the x-ray description was received but they need the pictures as well.

## 2018-06-23 ENCOUNTER — Other Ambulatory Visit (HOSPITAL_BASED_OUTPATIENT_CLINIC_OR_DEPARTMENT_OTHER): Payer: Self-pay

## 2018-06-29 ENCOUNTER — Ambulatory Visit (HOSPITAL_BASED_OUTPATIENT_CLINIC_OR_DEPARTMENT_OTHER)
Admission: RE | Admit: 2018-06-29 | Discharge: 2018-06-29 | Disposition: A | Discharge status: Home / Self Care | Payer: 59 | Source: Ambulatory Visit | Attending: Family Medicine | Admitting: Family Medicine

## 2018-06-29 ENCOUNTER — Encounter (HOSPITAL_COMMUNITY): Payer: Self-pay | Admitting: Family Medicine

## 2018-06-29 DIAGNOSIS — N959 Unspecified menopausal and perimenopausal disorder: Secondary | ICD-10-CM | POA: Diagnosis present

## 2018-06-29 DIAGNOSIS — Z1231 Encounter for screening mammogram for malignant neoplasm of breast: Secondary | ICD-10-CM | POA: Insufficient documentation

## 2018-06-29 DIAGNOSIS — I10 Essential (primary) hypertension: Secondary | ICD-10-CM | POA: Insufficient documentation

## 2018-06-29 DIAGNOSIS — M8580 Other specified disorders of bone density and structure, unspecified site: Secondary | ICD-10-CM

## 2018-06-29 DIAGNOSIS — R011 Cardiac murmur, unspecified: Secondary | ICD-10-CM

## 2018-06-29 HISTORY — DX: Other specified disorders of bone density and structure, unspecified site: M85.80

## 2018-06-29 NOTE — Progress Notes (Signed)
  Echocardiogram 2D Echocardiogram has been performed.  Marcy Sookdeo T Cedarius Kersh 06/29/2018, 12:03 PM

## 2018-07-01 ENCOUNTER — Other Ambulatory Visit: Payer: Self-pay | Admitting: Family Medicine

## 2018-07-01 DIAGNOSIS — I5020 Unspecified systolic (congestive) heart failure: Secondary | ICD-10-CM | POA: Insufficient documentation

## 2018-07-01 HISTORY — DX: Unspecified systolic (congestive) heart failure: I50.20

## 2018-07-01 MED ORDER — LISINOPRIL 10 MG PO TABS: 10.0000 mg | ORAL_TABLET | Freq: Every day | ORAL | 0 refills | Status: DC

## 2018-07-01 MED ORDER — CARVEDILOL 3.125 MG PO TABS: 3.1250 mg | ORAL_TABLET | Freq: Two times a day (BID) | ORAL | 0 refills | Status: DC

## 2018-07-01 NOTE — Progress Notes (Signed)
jen- Let her know that the lisinopril has a side effect of a dry cough, let me know if this happens. Other Side effects of medication is swelling of her lips-angioedema. She is to call me if feels like they have a dry cough and they are to call 911 or go to ER if any signs/symptoms of angioedema.

## 2018-07-15 ENCOUNTER — Ambulatory Visit (INDEPENDENT_AMBULATORY_CARE_PROVIDER_SITE_OTHER): Payer: 59

## 2018-07-15 ENCOUNTER — Encounter: Payer: Self-pay | Admitting: Family Medicine

## 2018-07-15 ENCOUNTER — Ambulatory Visit (INDEPENDENT_AMBULATORY_CARE_PROVIDER_SITE_OTHER): Payer: 59 | Admitting: Family Medicine

## 2018-07-15 VITALS — BP 124/84 | HR 87 | Temp 97.8°F | Ht 65.5 in | Wt 163.8 lb

## 2018-07-15 DIAGNOSIS — M542 Cervicalgia: Secondary | ICD-10-CM

## 2018-07-15 DIAGNOSIS — E782 Mixed hyperlipidemia: Secondary | ICD-10-CM | POA: Diagnosis not present

## 2018-07-15 DIAGNOSIS — I5020 Unspecified systolic (congestive) heart failure: Secondary | ICD-10-CM | POA: Diagnosis not present

## 2018-07-15 DIAGNOSIS — I10 Essential (primary) hypertension: Secondary | ICD-10-CM | POA: Diagnosis not present

## 2018-07-15 NOTE — Progress Notes (Signed)
Patient: Erica Liu MRN: [id number] DOB: 1946/03/23 PCP: Orland Mustard, MD     Subjective:  Chief Complaint  Patient presents with  . Congestive Heart Failure  . Hypertension    HPI: The patient is a 72 y.o. female who presents today for blood pressure and CHF follow up. She had an echo done a few weeks ago which revealed systolic CHF. EF was 45-50% with septal motion abnormality and dyssynergy with conduction delay on monitor strip. Mild AS. I  Feel like this is most likely due to uncontrolled HTN. I started her on coreg and lisinopril and had her follow up today to check BP and see how she is tolerating medication. I also referred her to cardiology. She has not made this appointment as she wanted to talk to me first.   Neck pain: she has had for months. It started in [place] when she had walked miles and felt very tight in the back of her neck. She can't even walk for a few minutes with out her neck hurting. Pain rated as a 6/10 at it's worst. Described as stiff/ache. It also burns her when she walks. She has no radiation of pain, no weakness, no tingling, no loss of sensation in her arms. Massages has helped. Heating pad has helped. She has done some PT and is going to try acupuncture. Has aleve, but has not taken it. Has a muscle relaxer, but has not tried this either. She does not like medication.   Hyperlipidemia: I started her on crestor after our last visit due to ascvd risk of 11.5%, which will actually be greater now with HTN diagnosis. She has stopped taking this and wants to try diet/exercise first. She has lost 9 more pounds and cut out all fat from her diet. She will go on medication if repeat labs are not much improved or if risk factor does not change much.    Review of Systems  Constitutional: Negative for fatigue.  Respiratory: Negative for shortness of breath.   Cardiovascular: Negative for chest pain.  Gastrointestinal: Negative for abdominal pain and nausea.   Musculoskeletal: Positive for neck pain. Negative for back pain.       Pre-existing neck pain  Neurological: Negative for dizziness and headaches.  Psychiatric/Behavioral: Positive for sleep disturbance. The patient is not nervous/anxious.     Allergies Patient is allergic to neomycin.  Past Medical History Patient  has a past medical history of Arthritis, H/O miscarriage, not currently pregnant, and History of chicken pox.  Surgical History Patient  has a past surgical history that includes Tonsillectomy (1953).  Family History Pateint's family history includes Alcohol abuse in her son; Arthritis in her daughter; Cancer in her maternal grandmother and mother; Diabetes in her sister; Early death in her paternal grandmother; Heart attack in her father and maternal grandfather; Heart disease in her father; Heart disease (age of onset: 3) in her mother; Hyperlipidemia in her mother; Hypertension in her mother.  Social History Patient  reports that she quit smoking about 29 years ago. She has a 7.50 pack-year smoking history. She has never used smokeless tobacco. She reports that she drinks alcohol. She reports that she does not use drugs.    Objective: Vitals:   07/15/18 1033  BP: 124/84  Pulse: 87  Temp: 97.8 F (36.6 C)  TempSrc: Oral  SpO2: 97%  Weight: 163 lb 12.8 oz (74.3 kg)  Height: 5' 5.5" (1.664 m)    Body mass index is 26.84 kg/m.  Physical Exam  Constitutional: She is oriented to person, place, and time. She appears well-developed and well-nourished.  Neck: Normal range of motion. Neck supple.  Cardiovascular: Normal rate, regular rhythm and intact distal pulses.  Murmur heard. Pulmonary/Chest: Effort normal and breath sounds normal.  Abdominal: Soft. Bowel sounds are normal.  Musculoskeletal:  TTP over trapezius. L>R. Neck ROM intact. Slight hump/kyphosis   Neurological: She is alert and oriented to person, place, and time.  Skin: Skin is warm and dry.   Vitals reviewed.  Cervical spine xray: appears to have some degenerative changes. Official read pending.      Assessment/plan: 1. Neck pain Appears more muscle in nature, but has some degenerative changes on film. Responds to heat and massages. Will do another trial of PT and referral done to our location.  Acupuncture can be tried as well. discussed with her that if no benefit from therapy we can do MRI to see if shed be a candidate for cervical injections. Having no radicular symptoms at this point, so really want her to try conservative therapy. Also want her to try muscle relaxer prn to see if she gets some relief.  - DG Cervical Spine Complete; Future - Ambulatory referral to Physical Therapy  2. Systolic congestive heart failure, unspecified HF chronicity (HCC) Mild. On beta blocker and ace-I. Likely secondary to uncontrolled HTN. Want her to see cardiology, but they would rather research cardiologist they would like to see and will let me know. Also discussed the cardiologist will be one to decide if she needs a stress test. She can continue with her walking. Discussed signs of CHF exacerbation including cough, weight gain and swelling legs as well as shortness of breath. Again, her heart failure is mild and I think she will respond quite well to medication, but I want her to follow with cardiology. Answered all of her questions.   3. Benign essential HTN Much, much improved and to goal. Will continue current medication. F/u in 6 months or as needed.   4. Mixed hyperlipidemia Discussed I think she needs to be on this, but if she wants to try diet she can for 6 months. I will add on a high sensitivity crp as well and discussed with her that if this is elevated or ascvd risk continues to be elevated, I would recommend she go back on this. Will also have cardiology discuss with her.     Return in about 6 months (around 01/13/2019).    Orland Mustard, MD [facility]     07/15/2018

## 2018-07-15 NOTE — Patient Instructions (Signed)
-  okay to stop the crestor if you want to try naturally, but cardiologist may have more to say on this. email me in 6 months so we can repeat your fasting lipids and add on cardiac marker for this  -look up cardiologists and email me this week so we can refer you to see them  -neck xray today, continue what you are doing and try muscle relaxer as needed. If continues we can MRI.

## 2018-07-19 ENCOUNTER — Encounter: Payer: Self-pay | Admitting: Family Medicine

## 2018-07-21 ENCOUNTER — Other Ambulatory Visit: Payer: Self-pay | Admitting: Family Medicine

## 2018-07-23 ENCOUNTER — Other Ambulatory Visit: Payer: Self-pay | Admitting: Family Medicine

## 2018-07-26 ENCOUNTER — Other Ambulatory Visit: Payer: Self-pay | Admitting: Family Medicine

## 2018-07-26 ENCOUNTER — Other Ambulatory Visit: Payer: Self-pay

## 2018-07-26 MED ORDER — LISINOPRIL 10 MG PO TABS: 10.0000 mg | ORAL_TABLET | Freq: Every day | ORAL | 0 refills | Status: DC

## 2018-07-26 MED ORDER — LISINOPRIL 10 MG PO TABS: 10.0000 mg | ORAL_TABLET | Freq: Every day | ORAL | 1 refills | Status: DC

## 2018-07-26 MED ORDER — CARVEDILOL 3.125 MG PO TABS: ORAL_TABLET | ORAL | 1 refills | Status: DC

## 2018-08-03 ENCOUNTER — Ambulatory Visit (INDEPENDENT_AMBULATORY_CARE_PROVIDER_SITE_OTHER): Payer: 59 | Admitting: Physical Therapy

## 2018-08-03 ENCOUNTER — Encounter: Payer: Self-pay | Admitting: Physical Therapy

## 2018-08-03 DIAGNOSIS — M542 Cervicalgia: Secondary | ICD-10-CM

## 2018-08-03 NOTE — Therapy (Signed)
[facility] [address] [place], [place], [address] Phone: [phone]   Fax:  [phone]  Physical Therapy Evaluation  Patient Details  Name: Erica Liu MRN: [id number] Date of Birth: April 02, 1946 Referring Provider (PT): Orland Mustard   Encounter Date: 08/03/2018  PT End of Session - 08/03/18 1412    Visit Number  1    Number of Visits  12    Date for PT Re-Evaluation  09/14/18    PT Start Time  1305    PT Stop Time  1345    PT Time Calculation (min)  40 min    Equipment Utilized During Treatment  Other (comment)    Activity Tolerance  Patient tolerated treatment well    Behavior During Therapy  [facility] for tasks assessed/performed       Past Medical History:  Diagnosis Date  . Arthritis    knee?  . H/O miscarriage, not currently pregnant    2  . History of chicken pox     Past Surgical History:  Procedure Laterality Date  . TONSILLECTOMY  1953    There were no vitals filed for this visit.   Subjective Assessment - 08/03/18 1308    Subjective  Pt states increased pain in neck, [place]. She had a few visits for PT in August. States pain on L side of neck, upper trap region. Denies numbness/tingling. She is retired. She is going to [place] again at end of December, and doesnt want to have increased pain when she is walking.     Diagnostic tests  + Xray showing moderate degeneration in c-spine     Patient Stated Goals  Decreased pain.     Currently in Pain?  Yes    Pain Score  3     Pain Location  Neck    Pain Orientation  Left    Pain Descriptors / Indicators  Aching;Tightness    Pain Type  Chronic pain    Pain Onset  More than a month ago    Pain Frequency  Intermittent    Aggravating Factors   prolonged walking, prolonged sitting.     Pain Relieving Factors  Massage UT, heating pad         [facility] PT Assessment - 08/03/18 0001      Assessment   Medical Diagnosis  Neck  Pain    Referring Provider (PT)  Orland Mustard    Hand Dominance  --   both   Prior Therapy  yes, 3 visits for neck      Balance Screen   Has the patient fallen in the past 6 months  No      Prior Function   Level of Independence  Independent      Cognition   Overall Cognitive Status  Within Functional Limits for tasks assessed      ROM / Strength   AROM / PROM / Strength  AROM;Strength      AROM   Overall AROM Comments  Cervical: WFL; L shoulder: mild limitations for flexion and IR behind the back;  R shoulder: WFL;       Strength   Overall Strength Comments  Shoulders: 4-/5 bil;  scapular: 4-/5       Palpation   Palpation comment  Tightness and trigger points throughout L and R upper traps, Mild tightness in sub occipitals.  Objective measurements completed on examination: See above findings.      [facility] Adult PT Treatment/Exercise - 08/03/18 0001      Posture/Postural Control   Posture Comments  --             PT Education - 08/03/18 1412    Education Details  PT POC, posture     Person(s) Educated  Patient    Methods  Explanation;Demonstration;Verbal cues    Comprehension  Verbalized understanding;Need further instruction;Verbal cues required       PT Short Term Goals - 08/03/18 1537      PT SHORT TERM GOAL #1   Title  Pt to be indepenent with initial HEP for neck pain and posture     Time  2    Period  Weeks    Status  New    Target Date  08/17/18      PT SHORT TERM GOAL #2   Title  Pt to report decreased pain in cervical region to 3/10 with activity     Time  2    Period  Weeks    Status  New    Target Date  08/17/18        PT Long Term Goals - 08/03/18 1538      PT LONG TERM GOAL #1   Title  Pt to be independent wtih final HEP for neck pain and postural/UE strengthening     Time  6    Period  Weeks    Status  New    Target Date  09/14/18      PT LONG TERM GOAL #2   Title  Pt to report decreased pain in  cervical region to 0-2/10 with activity    Time  6    Period  Weeks    Status  New    Target Date  09/14/18      PT LONG TERM GOAL #3   Title  Pt to demo increased strength of shoulders and scapular region to at least 4/5, to improve stability, pain, and to decrease compensation with upper traps.     Time  6    Period  Weeks    Status  New    Target Date  09/14/18      PT LONG TERM GOAL #4   Title  Pt to demo ability to self correct posture,90% of the time in clinic.     Time  6    Period  Weeks    Status  New    Target Date  09/14/18             Plan - 08/03/18 1542    Clinical Impression Statement  Pt presents with primary complaint of increased pain in cervical region, consistent with OA and poor posture. Pt with poor seated posture, with forward head and increased thoracic kyphosis. She has increased tightness and tenderness in bilateral upper traps and cervcial musculature. Pt with lack of effective HEP, and will benefit from education on this as well as posture. Pt with minimal ROM deficits, but has increased pain with ROM. Pt with decreased ability for full functional activities due to pain. Pt to benefit from skilled PT to improve deficits and pain.     Clinical Presentation  Stable    Clinical Decision Making  Low    Rehab Potential  Good    PT Frequency  2x / week    PT Duration  6 weeks    PT Treatment/Interventions  ADLs/Self Care Home Management;Cryotherapy;Electrical Stimulation;Iontophoresis 4mg /ml Dexamethasone;Moist Heat;Therapeutic activities;Functional mobility training;Ultrasound;Traction;Therapeutic exercise;Neuromuscular re-education;Patient/family education;Dry needling;Passive range of motion;Manual techniques;Taping;Spinal Manipulations;Joint Manipulations    PT Next Visit Plan  Teach HEP for posture, and stretches for neck/UTs.     Consulted and Agree with Plan of Care  Patient       Patient will benefit from skilled therapeutic intervention in order  to improve the following deficits and impairments:  Decreased activity tolerance, Decreased strength, Pain, Increased muscle spasms, Decreased mobility, Decreased range of motion, Improper body mechanics, Postural dysfunction, Impaired flexibility  Visit Diagnosis: Neck pain     Problem List Patient Active Problem List   Diagnosis Date Noted  . Systolic CHF (HCC) 07/01/2018  . Osteopenia 06/29/2018  . Breast cancer screening 11/27/2014  . Colon cancer screening 11/27/2014  . Impetigo 11/27/2014  . Elevated blood pressure 11/27/2014    Sedalia Muta, PT, DPT 3:50 PM  08/03/18    [facility] [address] [place], [place], [address] Phone: [phone]   Fax:  [phone]  Name: Kawehi Hostetter MRN: [id number] Date of Birth: 09/21/1945

## 2018-08-05 ENCOUNTER — Encounter: Payer: Self-pay | Admitting: Physical Therapy

## 2018-08-05 ENCOUNTER — Ambulatory Visit: Payer: 59 | Attending: Family Medicine | Admitting: Physical Therapy

## 2018-08-05 ENCOUNTER — Telehealth: Payer: Self-pay | Admitting: Family Medicine

## 2018-08-05 DIAGNOSIS — M6281 Muscle weakness (generalized): Secondary | ICD-10-CM

## 2018-08-05 DIAGNOSIS — M542 Cervicalgia: Secondary | ICD-10-CM | POA: Insufficient documentation

## 2018-08-05 NOTE — Therapy (Signed)
[facility] [address] [place], [place], [address] Phone: [phone]   Fax:  [phone]  Physical Therapy Treatment  Patient Details  Name: Erica Liu MRN: [id number] Date of Birth: 26-Jun-1946 Referring Provider (PT): Orland Mustard   Encounter Date: 08/05/2018  PT End of Session - 08/05/18 1816    Visit Number  2    Number of Visits  12    Date for PT Re-Evaluation  09/14/18    Authorization Type  [employer]     PT Start Time  [phone]    PT Stop Time  0940    PT Time Calculation (min)  49 min    Activity Tolerance  Patient tolerated treatment well       Past Medical History:  Diagnosis Date  . Arthritis    knee?  . H/O miscarriage, not currently pregnant    2  . History of chicken pox     Past Surgical History:  Procedure Laterality Date  . TONSILLECTOMY  1953    There were no vitals filed for this visit.  Subjective Assessment - 08/05/18 0855    Subjective  Burning right neck with walking.  I think it's arthritis and my posture is not good.   Going to [employer] in a couple of weeks.  I've been walking 15 minutes at a time here and there.  I get a spasm in my neck when I lie down at night.  I went to 2 different PT clinics but they didn't take my insurance.      Diagnostic tests  + Xray showing moderate degeneration in c-spine C5-6    Currently in Pain?  Yes    Pain Score  1     Pain Location  Neck    Pain Type  Chronic pain    Aggravating Factors   AM stiffness                       [facility] Adult PT Treatment/Exercise - 08/05/18 0001      Self-Care   Self-Care  Other Self-Care Comments    Other Self-Care Comments   discussion of home TENs option       Neck Exercises: Seated   Other Seated Exercise  thoracic extension with neck supported with hands 10x    Other Seated Exercise  scapular retraction 10x       Neck Exercises: Supine   Neck Retraction  10 reps    Other Supine Exercise   shoulder retraction 10x      Moist Heat Therapy   Number Minutes Moist Heat  12 Minutes    Moist Heat Location  Cervical      Electrical Stimulation   Electrical Stimulation Location  cervical     Electrical Stimulation Action  IFC    Electrical Stimulation Parameters  7 ma 12 min     Electrical Stimulation Goals  Pain      Manual Therapy   Soft tissue mobilization  suboccipitals, paraspinals, upper traps     Manual Traction  5 x 10 sec              PT Education - 08/05/18 0934    Education Details   Access Code: [email or web address] ;  dry needling info; home TENS info    Person(s) Educated  Patient    Methods  Explanation;Demonstration;Handout    Comprehension  Returned demonstration;Verbalized understanding       PT Short Term Goals -  08/03/18 1537      PT SHORT TERM GOAL #1   Title  Pt to be indepenent with initial HEP for neck pain and posture     Time  2    Period  Weeks    Status  New    Target Date  08/17/18      PT SHORT TERM GOAL #2   Title  Pt to report decreased pain in cervical region to 3/10 with activity     Time  2    Period  Weeks    Status  New    Target Date  08/17/18        PT Long Term Goals - 08/03/18 1538      PT LONG TERM GOAL #1   Title  Pt to be independent wtih final HEP for neck pain and postural/UE strengthening     Time  6    Period  Weeks    Status  New    Target Date  09/14/18      PT LONG TERM GOAL #2   Title  Pt to report decreased pain in cervical region to 0-2/10 with activity    Time  6    Period  Weeks    Status  New    Target Date  09/14/18      PT LONG TERM GOAL #3   Title  Pt to demo increased strength of shoulders and scapular region to at least 4/5, to improve stability, pain, and to decrease compensation with upper traps.     Time  6    Period  Weeks    Status  New    Target Date  09/14/18      PT LONG TERM GOAL #4   Title  Pt to demo ability to self correct posture,90% of the time in clinic.     Time  6     Period  Weeks    Status  New    Target Date  09/14/18            Plan - 08/05/18 1816    Clinical Impression Statement  The patient needs verbal cues for correct technique with cervical retractions, more comfortable in supine vs. seated.  Increased thoracic kyphosis and patient reports she has osteopenia.  She has tender points in bil upper traps and paraspinals.  She is considering dry needling and will research.  Good response to ES/heat.  Therapist closely monitoring response to all treatment interventions.      Rehab Potential  Good    PT Frequency  2x / week    PT Duration  6 weeks    PT Treatment/Interventions  ADLs/Self Care Home Management;Cryotherapy;Electrical Stimulation;Iontophoresis 4mg /ml Dexamethasone;Moist Heat;Therapeutic activities;Functional mobility training;Ultrasound;Traction;Therapeutic exercise;Neuromuscular re-education;Patient/family education;Dry needling;Passive range of motion;Manual techniques;Taping;Spinal Manipulations;Joint Manipulations    PT Next Visit Plan  considering DN;  manual therapy;  try supine band ex; seated or standing rows and extensions;  ES/heat       Patient will benefit from skilled therapeutic intervention in order to improve the following deficits and impairments:  Decreased activity tolerance, Decreased strength, Pain, Increased muscle spasms, Decreased mobility, Decreased range of motion, Improper body mechanics, Postural dysfunction, Impaired flexibility  Visit Diagnosis: Cervicalgia  Muscle weakness (generalized)     Problem List Patient Active Problem List   Diagnosis Date Noted  . Systolic CHF (HCC) 07/01/2018  . Osteopenia 06/29/2018  . Breast cancer screening 11/27/2014  . Colon cancer screening 11/27/2014  . Impetigo 11/27/2014  .  Elevated blood pressure 11/27/2014   Lavinia Sharps, PT 08/05/18 6:23 PM Phone: [phone] Fax: [phone]  Vivien Presto 08/05/2018, 6:22 PM  [facility] [address], [address] [place], [place], [address] Phone: [phone]   Fax:  [phone]  Name: Erica Liu MRN: [id number] Date of Birth: [birth year]

## 2018-08-05 NOTE — Patient Instructions (Signed)
Access Code: [facility]  URL: [email or web address]  Date: 08/05/2018  Prepared by: Lavinia Sharps   Exercises  Seated Scapular Retraction - 10 reps - 1x daily - 7x weekly  Seated Thoracic Lumbar Extension - 10 reps - 1 sets - 1x daily - 7x weekly  Supine Chin Tuck - 10 reps - 1 sets - 1x daily - 7x weekly  Supine Scapular Retraction - 10 reps - 1 sets - 1x daily - 7x weekly     TENS UNIT  This is helpful for muscle pain and spasm.   Search and Purchase a TENS 7000 2nd edition at www.tenspros.com or www.amazon.com  (It should be less than $30)     TENS unit instructions:   Do not shower or bathe with the unit on  Turn the unit off before removing electrodes or batteries  If the electrodes lose stickiness add a drop of water to the electrodes after they are disconnected from the unit and place on plastic sheet. If you continued to have difficulty, call the TENS unit company to purchase more electrodes.  Do not apply lotion on the skin area prior to use. Make sure the skin is clean and dry as this will help prolong the life of the electrodes.  After use, always check skin for unusual red areas, rash or other skin difficulties. If there are any skin problems, does not apply electrodes to the same area.  Never remove the electrodes from the unit by pulling the wires.  Do not use the TENS unit or electrodes other than as directed.  Do not change electrode placement without consulting your therapist or physician.  Keep 2 fingers with between each electrode.

## 2018-08-05 NOTE — Telephone Encounter (Signed)
I have emailed charge corrections and am awaiting a response to assist before contacting the patient with a solution.

## 2018-08-05 NOTE — Telephone Encounter (Signed)
-----   Message from Juliette Alcide sent at 08/03/2018 12:52 PM EST ----- Contact: [phone] Pt came in office with bill from 05/31/18. Pt stated that this was a CPE. When I looked at the appt it was for TOC. Copy of bill is in your basket. Pt stated she already paid this but would like to know exactly what it was for if the CPE was not paid for.

## 2018-08-10 ENCOUNTER — Ambulatory Visit: Payer: 59 | Admitting: Physical Therapy

## 2018-08-10 ENCOUNTER — Encounter: Payer: Self-pay | Admitting: Physical Therapy

## 2018-08-10 DIAGNOSIS — M542 Cervicalgia: Secondary | ICD-10-CM

## 2018-08-10 DIAGNOSIS — M6281 Muscle weakness (generalized): Secondary | ICD-10-CM

## 2018-08-10 NOTE — Therapy (Signed)
[facility] [address] [place], [place], [address] Phone: [phone]   Fax:  [phone]  Physical Therapy Treatment  Patient Details  Name: Erica Liu MRN: [id number] Date of Birth: 1946-04-28 Referring Provider (PT): Orland Mustard   Encounter Date: 08/10/2018  PT End of Session - 08/10/18 1527    Visit Number  3    Number of Visits  12    Date for PT Re-Evaluation  09/14/18    Authorization Type  [employer]     PT Start Time  1446    PT Stop Time  1535    PT Time Calculation (min)  49 min    Activity Tolerance  Patient tolerated treatment well       Past Medical History:  Diagnosis Date  . Arthritis    knee?  . H/O miscarriage, not currently pregnant    2  . History of chicken pox     Past Surgical History:  Procedure Laterality Date  . TONSILLECTOMY  1953    There were no vitals filed for this visit.  Subjective Assessment - 08/10/18 1450    Subjective  I woke up during the night with pain up high neck.  I haven't done my exercises b/c I worked with my daughter at the [employer] all weekend.  I did do the exercises this morning.      Diagnostic tests  + Xray showing moderate degeneration in c-spine C5-6    Currently in Pain?  Yes    Pain Score  3     Pain Location  Neck    Pain Orientation  Left;Right    Pain Type  Chronic pain                       [facility] Adult PT Treatment/Exercise - 08/10/18 0001      Self-Care   Other Self-Care Comments   further discussion of home TENs option       Therapeutic Activites    ADL's  discussed walking program of  2-3 laps on driveway ;  discussed bike vs. Elliptical per patient request      Neck Exercises: Seated   Other Seated Exercise  verbal review of HEP and the importance of continuation for best long term outcomes      Moist Heat Therapy   Number Minutes Moist Heat  14 Minutes    Moist Heat Location  Cervical      Electrical  Stimulation   Electrical Stimulation Location  cervical     Electrical Stimulation Action  IFC    Electrical Stimulation Parameters  7 ma 14 min     Electrical Stimulation Goals  Pain      Manual Therapy   Soft tissue mobilization  suboccipitals, paraspinals, upper traps        Trigger Point Dry Needling - 08/10/18 1722    Consent Given?  Yes    Education Handout Provided  Yes    Muscles Treated Upper Body  Suboccipitals muscle group   bil cervical multifidi    SubOccipitals Response  Twitch response elicited;Palpable increased muscle length           PT Education - 08/10/18 1526    Education Details  dry needling after care    Person(s) Educated  Patient    Methods  Explanation;Demonstration;Handout    Comprehension  Returned demonstration;Verbalized understanding       PT Short Term Goals - 08/03/18 1537  PT SHORT TERM GOAL #1   Title  Pt to be indepenent with initial HEP for neck pain and posture     Time  2    Period  Weeks    Status  New    Target Date  08/17/18      PT SHORT TERM GOAL #2   Title  Pt to report decreased pain in cervical region to 3/10 with activity     Time  2    Period  Weeks    Status  New    Target Date  08/17/18        PT Long Term Goals - 08/03/18 1538      PT LONG TERM GOAL #1   Title  Pt to be independent wtih final HEP for neck pain and postural/UE strengthening     Time  6    Period  Weeks    Status  New    Target Date  09/14/18      PT LONG TERM GOAL #2   Title  Pt to report decreased pain in cervical region to 0-2/10 with activity    Time  6    Period  Weeks    Status  New    Target Date  09/14/18      PT LONG TERM GOAL #3   Title  Pt to demo increased strength of shoulders and scapular region to at least 4/5, to improve stability, pain, and to decrease compensation with upper traps.     Time  6    Period  Weeks    Status  New    Target Date  09/14/18      PT LONG TERM GOAL #4   Title  Pt to demo ability  to self correct posture,90% of the time in clinic.     Time  6    Period  Weeks    Status  New    Target Date  09/14/18            Plan - 08/10/18 1723    Clinical Impression Statement  Extensive discussion and education on myofascial pain as well as DN.  She is nervous about DN but is interested in trying it to address myofascial pain in mid and upper cervical region.  The patient has numerous tender points in this area.  Improved soft tissue mobility noted following DN and manual therapy.  Long discussion on return to walking program, she is concerned walking will be painful in her neck as it was initially.  She has an upcoming trip to [place] soon.  Therapist closely monitoring response with all treatment interventions.  Good pain relief with ES/heat and patient reports she has ordered a home TENs unit.      Rehab Potential  Good    PT Frequency  2x / week    PT Duration  6 weeks    PT Treatment/Interventions  ADLs/Self Care Home Management;Cryotherapy;Electrical Stimulation;Iontophoresis 4mg /ml Dexamethasone;Moist Heat;Therapeutic activities;Functional mobility training;Ultrasound;Traction;Therapeutic exercise;Neuromuscular re-education;Patient/family education;Dry needling;Passive range of motion;Manual techniques;Taping;Spinal Manipulations;Joint Manipulations    PT Next Visit Plan  assess response to  DN #1 and add upper trap if good response ;  manual therapy;  try supine band ex; seated or standing rows and extensions;  ES/heat as needed       Patient will benefit from skilled therapeutic intervention in order to improve the following deficits and impairments:  Decreased activity tolerance, Decreased strength, Pain, Increased muscle spasms, Decreased mobility, Decreased range of motion, Improper  body mechanics, Postural dysfunction, Impaired flexibility  Visit Diagnosis: Cervicalgia  Muscle weakness (generalized)     Problem List Patient Active Problem List   Diagnosis Date  Noted  . Systolic CHF (HCC) 07/01/2018  . Osteopenia 06/29/2018  . Breast cancer screening 11/27/2014  . Colon cancer screening 11/27/2014  . Impetigo 11/27/2014  . Elevated blood pressure 11/27/2014   Lavinia Sharps, PT 08/10/18 5:31 PM Phone: [phone] Fax: [phone]  Vivien Presto 08/10/2018, 5:30 PM  [facility] [address], [address] [place], [place], [address] Phone: [phone]   Fax:  [phone]  Name: Erica Liu MRN: [id number] Date of Birth: [birth year]

## 2018-08-10 NOTE — Patient Instructions (Signed)
     Trigger Point Dry Needling  . What is Trigger Point Dry Needling (DN)? o DN is a physical therapy technique used to treat muscle pain and dysfunction. Specifically, DN helps deactivate muscle trigger points (muscle knots).  o A thin filiform needle is used to penetrate the skin and stimulate the underlying trigger point. The goal is for a local twitch response (LTR) to occur and for the trigger point to relax. No medication of any kind is injected during the procedure.   . What Does Trigger Point Dry Needling Feel Like?  o The procedure feels different for each individual patient. Some patients report that they do not actually feel the needle enter the skin and overall the process is not painful. Very mild bleeding may occur. However, many patients feel a deep cramping in the muscle in which the needle was inserted. This is the local twitch response.   . How Will I feel after the treatment? o Soreness is normal, and the onset of soreness may not occur for a few hours. Typically this soreness does not last longer than two days.  o Bruising is uncommon, however; ice can be used to decrease any possible bruising.  o In rare cases feeling tired or nauseous after the treatment is normal. In addition, your symptoms may get worse before they get better, this period will typically not last longer than 24 hours.   . What Can I do After My Treatment? o Increase your hydration by drinking more water for the next 24 hours. o You may place ice or heat on the areas treated that have become sore, however, do not use heat on inflamed or bruised areas. Heat often brings more relief post needling. o You can continue your regular activities, but vigorous activity is not recommended initially after the treatment for 24 hours. o DN is best combined with other physical therapy such as strengthening, stretching, and other therapies.    Stacy Simpson PT [facility] [address], [address] [place], [place] [address] Phone # [phone] Fax [phone] 

## 2018-08-11 ENCOUNTER — Encounter: Payer: Self-pay | Admitting: Physical Therapy

## 2018-08-16 ENCOUNTER — Encounter: Payer: Self-pay | Admitting: Physical Therapy

## 2018-08-18 ENCOUNTER — Encounter: Payer: Self-pay | Admitting: Physical Therapy

## 2018-08-18 ENCOUNTER — Ambulatory Visit: Payer: 59 | Attending: Family Medicine

## 2018-08-18 DIAGNOSIS — M6281 Muscle weakness (generalized): Secondary | ICD-10-CM | POA: Diagnosis present

## 2018-08-18 DIAGNOSIS — M542 Cervicalgia: Secondary | ICD-10-CM | POA: Diagnosis present

## 2018-08-18 NOTE — Therapy (Signed)
[facility] [address] [place], [place], [address] Phone: [phone]   Fax:  [phone]  Physical Therapy Treatment  Patient Details  Name: Erica Liu MRN: [id number] Date of Birth: 04/18/1946 Referring Provider (PT): Orland Mustard   Encounter Date: 08/18/2018  PT End of Session - 08/18/18 1147    Visit Number  4    Date for PT Re-Evaluation  09/14/18    Authorization Type  [employer]     PT Start Time  1102    PT Stop Time  1203    PT Time Calculation (min)  61 min    Activity Tolerance  Patient tolerated treatment well    Behavior During Therapy  [facility] for tasks assessed/performed       Past Medical History:  Diagnosis Date  . Arthritis    knee?  . H/O miscarriage, not currently pregnant    2  . History of chicken pox     Past Surgical History:  Procedure Laterality Date  . TONSILLECTOMY  1953    There were no vitals filed for this visit.  Subjective Assessment - 08/18/18 1101    Subjective  I liked the needling.  I helped my pain for about a week.      Diagnostic tests  + Xray showing moderate degeneration in c-spine C5-6    Patient Stated Goals  Decreased pain.     Currently in Pain?  No/denies                       [facility] Adult PT Treatment/Exercise - 08/18/18 0001      Exercises   Exercises  Shoulder      Shoulder Exercises: Supine   Horizontal ABduction  Strengthening;Both;20 reps;Theraband    Theraband Level (Shoulder Horizontal ABduction)  Level 1 (Yellow)    External Rotation  Strengthening;Both;20 reps;Theraband    Theraband Level (Shoulder External Rotation)  Level 1 (Yellow)    Flexion  Strengthening;Both;20 reps;Theraband    Theraband Level (Shoulder Flexion)  Level 1 (Yellow)      Moist Heat Therapy   Number Minutes Moist Heat  15 Minutes    Moist Heat Location  Cervical      Electrical Stimulation   Electrical Stimulation Location  cervical     Electrical  Stimulation Action  IFC    Electrical Stimulation Parameters  15 minutes    Electrical Stimulation Goals  Pain      Manual Therapy   Soft tissue mobilization  suboccipitals, paraspinals, upper traps        Trigger Point Dry Needling - 08/18/18 1124    Consent Given?  Yes    Muscles Treated Upper Body  Suboccipitals muscle group;Upper trapezius   bil cervical multifidi   Upper Trapezius Response  Twitch reponse elicited;Palpable increased muscle length    SubOccipitals Response  Twitch response elicited;Palpable increased muscle length             PT Short Term Goals - 08/18/18 1106      PT SHORT TERM GOAL #1   Title  Pt to be indepenent with initial HEP for neck pain and posture     Status  Achieved      PT SHORT TERM GOAL #2   Title  Pt to report decreased pain in cervical region to 3/10 with activity     Status  On-going        PT Long Term Goals - 08/03/18 1538  PT LONG TERM GOAL #1   Title  Pt to be independent wtih final HEP for neck pain and postural/UE strengthening     Time  6    Period  Weeks    Status  New    Target Date  09/14/18      PT LONG TERM GOAL #2   Title  Pt to report decreased pain in cervical region to 0-2/10 with activity    Time  6    Period  Weeks    Status  New    Target Date  09/14/18      PT LONG TERM GOAL #3   Title  Pt to demo increased strength of shoulders and scapular region to at least 4/5, to improve stability, pain, and to decrease compensation with upper traps.     Time  6    Period  Weeks    Status  New    Target Date  09/14/18      PT LONG TERM GOAL #4   Title  Pt to demo ability to self correct posture,90% of the time in clinic.     Time  6    Period  Weeks    Status  New    Target Date  09/14/18            Plan - 08/18/18 1117    Clinical Impression Statement  Pt reports 65% overall improvement in symptoms since the start of care.  Pt is making postural corrections during her daily activity.  PT  initiated HEP for supine scapular strength today and pt required tactile cues to reduce scapular elevation.  Pt remains posturally weak and demonstrates fatigue with strength exercise.  Pt with trigger points in bil neck, suboccipitals and upper traps and demonstrated improved tissue mobility and reduced pain after dry needling today.  Pt will continue to benefit from skilled PT for postural strength, flexibility and manual for tissue mobility.      Rehab Potential  Good    PT Frequency  2x / week    PT Duration  6 weeks    PT Treatment/Interventions  ADLs/Self Care Home Management;Cryotherapy;Electrical Stimulation;Iontophoresis 4mg /ml Dexamethasone;Moist Heat;Therapeutic activities;Functional mobility training;Ultrasound;Traction;Therapeutic exercise;Neuromuscular re-education;Patient/family education;Dry needling;Passive range of motion;Manual techniques;Taping;Spinal Manipulations;Joint Manipulations    PT Next Visit Plan  assess response to  DN #2 and add upper trap if good response ;  manual therapy;  add supine exercise to HEP    PT Home Exercise Plan  [facility]    Recommended Other Services  initial certification is signed    Consulted and Agree with Plan of Care  Patient       Patient will benefit from skilled therapeutic intervention in order to improve the following deficits and impairments:  Decreased activity tolerance, Decreased strength, Pain, Increased muscle spasms, Decreased mobility, Decreased range of motion, Improper body mechanics, Postural dysfunction, Impaired flexibility  Visit Diagnosis: Cervicalgia  Muscle weakness (generalized)     Problem List Patient Active Problem List   Diagnosis Date Noted  . Systolic CHF (HCC) 07/01/2018  . Osteopenia 06/29/2018  . Breast cancer screening 11/27/2014  . Colon cancer screening 11/27/2014  . Impetigo 11/27/2014  . Elevated blood pressure 11/27/2014    Lorrene Reid, PT 08/18/18 11:48 AM  [facility] [address] [place], [place], [address] Phone: [phone]   Fax:  [phone]  Name: Onnie Alatorre MRN: [id number] Date of Birth: Aug 06, 1946

## 2018-08-23 ENCOUNTER — Ambulatory Visit: Payer: 59

## 2018-08-23 ENCOUNTER — Encounter: Payer: Self-pay | Admitting: Physical Therapy

## 2018-08-23 DIAGNOSIS — M542 Cervicalgia: Secondary | ICD-10-CM

## 2018-08-23 DIAGNOSIS — M6281 Muscle weakness (generalized): Secondary | ICD-10-CM

## 2018-08-23 NOTE — Therapy (Signed)
[facility] [address] [place], [place], [address] Phone: [phone]   Fax:  [phone]  Physical Therapy Treatment  Patient Details  Name: Erica Liu MRN: [id number] Date of Birth: December 04, 1945 Referring Provider (PT): Orland Mustard   Encounter Date: 08/23/2018  PT End of Session - 08/23/18 1310    Visit Number  5    Date for PT Re-Evaluation  09/14/18    Authorization Type  [employer]     PT Start Time  1234    PT Stop Time  1325    PT Time Calculation (min)  51 min    Activity Tolerance  Patient tolerated treatment well    Behavior During Therapy  [facility] for tasks assessed/performed       Past Medical History:  Diagnosis Date  . Arthritis    knee?  . H/O miscarriage, not currently pregnant    2  . History of chicken pox     Past Surgical History:  Procedure Laterality Date  . TONSILLECTOMY  1953    There were no vitals filed for this visit.  Subjective Assessment - 08/23/18 1236    Subjective  I want to do the needling next session.  Felt good after last session.      Currently in Pain?  Yes    Pain Location  Neck    Pain Orientation  Right;Left    Pain Descriptors / Indicators  Sore    Pain Type  Chronic pain    Pain Onset  More than a month ago    Pain Frequency  Intermittent    Aggravating Factors   unknown    Pain Relieving Factors  massage, exercise                       [facility] Adult PT Treatment/Exercise - 08/23/18 0001      Shoulder Exercises: Supine   Horizontal ABduction  Strengthening;Both;20 reps;Theraband    Theraband Level (Shoulder Horizontal ABduction)  Level 1 (Yellow)    External Rotation  Strengthening;Both;20 reps;Theraband    Theraband Level (Shoulder External Rotation)  Level 1 (Yellow)    Flexion  Strengthening;Both;20 reps;Theraband    Theraband Level (Shoulder Flexion)  Level 1 (Yellow)      Moist Heat Therapy   Number Minutes Moist Heat  15 Minutes     Moist Heat Location  Cervical      Electrical Stimulation   Electrical Stimulation Location  cervical     Electrical Stimulation Action  IFC    Electrical Stimulation Parameters  15 minutes    Electrical Stimulation Goals  Pain      Manual Therapy   Soft tissue mobilization  suboccipitals, paraspinals, upper traps. Elongation and trigger point release to Lt>Rt               PT Short Term Goals - 08/18/18 1106      PT SHORT TERM GOAL #1   Title  Pt to be indepenent with initial HEP for neck pain and posture     Status  Achieved      PT SHORT TERM GOAL #2   Title  Pt to report decreased pain in cervical region to 3/10 with activity     Status  On-going        PT Long Term Goals - 08/03/18 1538      PT LONG TERM GOAL #1   Title  Pt to be independent wtih final HEP for neck  pain and postural/UE strengthening     Time  6    Period  Weeks    Status  New    Target Date  09/14/18      PT LONG TERM GOAL #2   Title  Pt to report decreased pain in cervical region to 0-2/10 with activity    Time  6    Period  Weeks    Status  New    Target Date  09/14/18      PT LONG TERM GOAL #3   Title  Pt to demo increased strength of shoulders and scapular region to at least 4/5, to improve stability, pain, and to decrease compensation with upper traps.     Time  6    Period  Weeks    Status  New    Target Date  09/14/18      PT LONG TERM GOAL #4   Title  Pt to demo ability to self correct posture,90% of the time in clinic.     Time  6    Period  Weeks    Status  New    Target Date  09/14/18            Plan - 08/23/18 1250    Clinical Impression Statement  Pt reports 65% overall improvement in symptoms since the start of care.  PT issued HEP For postural strength today and pt continues to do cervical flexibility exercises at home.  Pt required frequent verbal cues for scapular depression with supine exercises.  Pt with tension and trigger points in bil neck and upper  traps and demonstrated improved tissue mobility and reduce pain after manual therapy today.  Pt will continue to benefit from skilled PT to address neck pain, trigger points, stiffness and weakness.      Rehab Potential  Good    PT Frequency  2x / week    PT Duration  6 weeks    PT Treatment/Interventions  ADLs/Self Care Home Management;Cryotherapy;Electrical Stimulation;Iontophoresis 4mg /ml Dexamethasone;Moist Heat;Therapeutic activities;Functional mobility training;Ultrasound;Traction;Therapeutic exercise;Neuromuscular re-education;Patient/family education;Dry needling;Passive range of motion;Manual techniques;Taping;Spinal Manipulations;Joint Manipulations    PT Next Visit Plan  Dry needling to neck and upper traps next, review HEP    PT Home Exercise Plan  [facility]    Consulted and Agree with Plan of Care  Patient       Patient will benefit from skilled therapeutic intervention in order to improve the following deficits and impairments:  Decreased activity tolerance, Decreased strength, Pain, Increased muscle spasms, Decreased mobility, Decreased range of motion, Improper body mechanics, Postural dysfunction, Impaired flexibility  Visit Diagnosis: Cervicalgia  Muscle weakness (generalized)     Problem List Patient Active Problem List   Diagnosis Date Noted  . Systolic CHF (HCC) 07/01/2018  . Osteopenia 06/29/2018  . Breast cancer screening 11/27/2014  . Colon cancer screening 11/27/2014  . Impetigo 11/27/2014  . Elevated blood pressure 11/27/2014    Lorrene Reid, PT 08/23/18 1:16 PM  [facility] [address] [place], [place], [address] Phone: [phone]   Fax:  [phone]  Name: Kristle Wojahn MRN: [id number] Date of Birth: 1946-02-09

## 2018-08-25 ENCOUNTER — Encounter: Payer: Self-pay | Admitting: Physical Therapy

## 2018-08-25 ENCOUNTER — Ambulatory Visit: Payer: 59

## 2018-08-25 DIAGNOSIS — M542 Cervicalgia: Secondary | ICD-10-CM | POA: Diagnosis not present

## 2018-08-25 DIAGNOSIS — M6281 Muscle weakness (generalized): Secondary | ICD-10-CM

## 2018-08-25 NOTE — Therapy (Signed)
[facility] [address], [address] [place], [place], [address] Phone: [phone]   Fax:  [phone]  Physical Therapy Treatment  Patient Details  Name: Erica Liu MRN: [id number] Date of Birth: 02-Dec-1945 Referring Provider (PT): Orland Mustard   Encounter Date: 08/25/2018  PT End of Session - 08/25/18 1310    Visit Number  6    Date for PT Re-Evaluation  09/14/18    PT Start Time  1232    PT Stop Time  1323    PT Time Calculation (min)  51 min    Activity Tolerance  Patient tolerated treatment well    Behavior During Therapy  [facility] for tasks assessed/performed       Past Medical History:  Diagnosis Date  . Arthritis    knee?  . H/O miscarriage, not currently pregnant    2  . History of chicken pox     Past Surgical History:  Procedure Laterality Date  . TONSILLECTOMY  1953    There were no vitals filed for this visit.  Subjective Assessment - 08/25/18 1231    Subjective  Yesterday I felt very sore.      Currently in Pain?  Yes    Pain Score  3     Pain Location  Neck    Pain Orientation  Right;Left    Pain Descriptors / Indicators  Sore;Burning                       [facility] Adult PT Treatment/Exercise - 08/25/18 0001      Shoulder Exercises: Supine   Horizontal ABduction  Strengthening;Both;20 reps;Theraband    Theraband Level (Shoulder Horizontal ABduction)  Level 1 (Yellow)    External Rotation  Strengthening;Both;20 reps;Theraband    Theraband Level (Shoulder External Rotation)  Level 1 (Yellow)    Flexion  Strengthening;Both;20 reps;Theraband    Theraband Level (Shoulder Flexion)  Level 1 (Yellow)      Moist Heat Therapy   Number Minutes Moist Heat  15 Minutes    Moist Heat Location  Cervical      Electrical Stimulation   Electrical Stimulation Location  cervical     Electrical Stimulation Action  IFC    Electrical Stimulation Parameters  15 minutes    Electrical Stimulation  Goals  Pain      Manual Therapy   Soft tissue mobilization  suboccipitals, paraspinals, upper traps. Elongation and trigger point release to Lt>Rt       Trigger Point Dry Needling - 08/25/18 1247    Consent Given?  Yes    Muscles Treated Upper Body  Suboccipitals muscle group;Upper trapezius   bil cervical multifidi   Upper Trapezius Response  Twitch reponse elicited;Palpable increased muscle length    SubOccipitals Response  Twitch response elicited;Palpable increased muscle length             PT Short Term Goals - 08/18/18 1106      PT SHORT TERM GOAL #1   Title  Pt to be indepenent with initial HEP for neck pain and posture     Status  Achieved      PT SHORT TERM GOAL #2   Title  Pt to report decreased pain in cervical region to 3/10 with activity     Status  On-going        PT Long Term Goals - 08/25/18 1240      PT LONG TERM GOAL #2   Title  Pt to  report decreased pain in cervical region to 0-2/10 with activity    Baseline  depends on time of day    Time  6    Period  Weeks    Status  On-going      PT LONG TERM GOAL #4   Title  Pt to demo ability to self correct posture,90% of the time in clinic.     Baseline  50% of the time    Time  6    Period  Weeks    Status  On-going            Plan - 08/25/18 1246    Clinical Impression Statement  Pt reports Rt upper trap burning when she was walking yesterday. Pt is independent and complaint with HEP for strength and flexibility.  Pt with tension and trigger points in bil neck and upper traps and demonstrated improved tissue mobility and reduced pain after dry needling today.  Pt required frequent verbal cues for scapular depression with supine exercise today.  Pt will continue to benefit from skilled PT for postural strength, flexibility and manual therapy to reduce pain.      Rehab Potential  Good    PT Frequency  2x / week    PT Duration  6 weeks    PT Treatment/Interventions  ADLs/Self Care Home  Management;Cryotherapy;Electrical Stimulation;Iontophoresis 4mg /ml Dexamethasone;Moist Heat;Therapeutic activities;Functional mobility training;Ultrasound;Traction;Therapeutic exercise;Neuromuscular re-education;Patient/family education;Dry needling;Passive range of motion;Manual techniques;Taping;Spinal Manipulations;Joint Manipulations    PT Next Visit Plan  see how pt did on her trip.  Pt will have a lapse in treatment due to travel.      PT Home Exercise Plan  [facility]    Consulted and Agree with Plan of Care  Patient       Patient will benefit from skilled therapeutic intervention in order to improve the following deficits and impairments:  Decreased activity tolerance, Decreased strength, Pain, Increased muscle spasms, Decreased mobility, Decreased range of motion, Improper body mechanics, Postural dysfunction, Impaired flexibility  Visit Diagnosis: Cervicalgia  Muscle weakness (generalized)     Problem List Patient Active Problem List   Diagnosis Date Noted  . Systolic CHF (HCC) 07/01/2018  . Osteopenia 06/29/2018  . Breast cancer screening 11/27/2014  . Colon cancer screening 11/27/2014  . Impetigo 11/27/2014  . Elevated blood pressure 11/27/2014    Lorrene Reid, PT 08/25/18 1:12 PM  [facility] [address] [place], [place], [address] Phone: [phone]   Fax:  [phone]  Name: Erica Liu MRN: [id number] Date of Birth: [birth year]

## 2018-09-06 ENCOUNTER — Encounter: Payer: Self-pay | Admitting: Physical Therapy

## 2018-09-09 ENCOUNTER — Encounter: Payer: Self-pay | Admitting: Physical Therapy

## 2018-09-09 ENCOUNTER — Ambulatory Visit: Payer: 59 | Admitting: Physical Therapy

## 2018-09-09 DIAGNOSIS — M6281 Muscle weakness (generalized): Secondary | ICD-10-CM

## 2018-09-09 DIAGNOSIS — M542 Cervicalgia: Secondary | ICD-10-CM

## 2018-09-09 NOTE — Therapy (Signed)
[facility] [address] [place], [place], [address] Phone: [phone]   Fax:  [phone]  Physical Therapy Treatment  Patient Details  Name: Erica Liu MRN: [id number] Date of Birth: 1946/05/22 Referring Provider (PT): Orland Mustard   Encounter Date: 09/09/2018  PT End of Session - 09/09/18 1233    Visit Number  7    Number of Visits  12    Date for PT Re-Evaluation  09/14/18    Authorization Type  [employer]     PT Start Time  1148    PT Stop Time  1235    PT Time Calculation (min)  47 min    Activity Tolerance  Patient tolerated treatment well       Past Medical History:  Diagnosis Date  . Arthritis    knee?  . Elevated blood pressure 11/27/2014  . H/O miscarriage, not currently pregnant    2  . History of chicken pox   . Impetigo 11/27/2014  . Osteopenia 06/29/2018   dexa 06/2018. t-score of -1.6. FRAX: 1.7/10.4. Repeat 3 years.   . Systolic CHF (HCC) 07/01/2018   Echo 06/2018. Started on acei/beta blocker. Referred to cards. EF 45-50%    Past Surgical History:  Procedure Laterality Date  . TONSILLECTOMY  1953    There were no vitals filed for this visit.  Subjective Assessment - 09/09/18 1152    Subjective  Did fine at [employer]. Some burning in my neck.  My husband noticed I really hunch over a lot.  I do the exercises with the band.   My husband bought me a TENS but I haven't used it yet.  I felt great when I got up this morning.  Undecided about DN.   It really hurt last time.    [facility] yet.  I want to see the cardiologist.      Currently in Pain?  Yes    Pain Score  1     Pain Location  Neck    Pain Type  Chronic pain                       [facility] Adult PT Treatment/Exercise - 09/09/18 0001      Neuro Re-ed    Neuro Re-ed Details   neuroscience of pain education       Neck Exercises: Prone   Axial Exension  10 reps    Other Prone  Exercise  bil shoulder extension 8x       Shoulder Exercises: Power [employer]  15 reps    Row Limitations  15#    Other Power Tower Exercises  seated lat bar 15# 13x       Manual Therapy   Soft tissue mobilization  suboccipitals, paraspinals, upper traps. Elongation and trigger point release to Lt>Rt    Manual Traction  5 x 10 sec     Muscle Energy Technique  uppper trap contract relax 3x 5 sec                PT Short Term Goals - 09/09/18 1255      PT SHORT TERM GOAL #1   Title  Pt to be indepenent with initial HEP for neck pain and posture     Status  Achieved      PT SHORT TERM GOAL #2   Title  Pt to report decreased pain in cervical region to  3/10 with activity     Status  Achieved        PT Long Term Goals - 08/25/18 1240      PT LONG TERM GOAL #2   Title  Pt to report decreased pain in cervical region to 0-2/10 with activity    Baseline  depends on time of day    Time  6    Period  Weeks    Status  On-going      PT LONG TERM GOAL #4   Title  Pt to demo ability to self correct posture,90% of the time in clinic.     Baseline  50% of the time    Time  6    Period  Weeks    Status  On-going            Plan - 09/09/18 1242    Clinical Impression Statement  The patient reports she is doing better overall but she is concerned about the "hunching over" of her back.   Added a progression of middle and lower trap strengthening in prone position and the patient was able to perform with verbal and tactile cues to squeeze shoulder blades and to keep head in neutral.  [facility] and [facility] some [facility].  She does need cues to avoid excessive thoracic region rounding and scapular protraction.  Much improved soft tissue length and fewer tender points noted during manual therapy.   She may benefit from DN but will use small needles secondary to sensitivity.      Rehab Potential  Good    PT Frequency  2x / week    PT  Duration  6 weeks    PT Treatment/Interventions  ADLs/Self Care Home Management;Cryotherapy;Electrical Stimulation;Iontophoresis 4mg /ml Dexamethasone;Moist Heat;Therapeutic activities;Functional mobility training;Ultrasound;Traction;Therapeutic exercise;Neuromuscular re-education;Patient/family education;Dry needling;Passive range of motion;Manual techniques;Taping;Spinal Manipulations;Joint Manipulations    PT Next Visit Plan  add prone head lift/arm lift to HEP;  do FOTO;  recheck cervical ROM and progress toward goals for ERO;  she may want limited DN ; may want home TENS instruction     PT [facility]  [facility]       Patient will benefit from skilled therapeutic intervention in order to improve the following deficits and impairments:  Decreased activity tolerance, Decreased strength, Pain, Increased muscle spasms, Decreased mobility, Decreased range of motion, Improper body mechanics, Postural dysfunction, Impaired flexibility  Visit Diagnosis: Cervicalgia  Muscle weakness (generalized)     Problem List Patient Active Problem List   Diagnosis Date Noted  . Systolic CHF (HCC) 07/01/2018  . Osteopenia 06/29/2018  . Breast cancer screening 11/27/2014  . Colon cancer screening 11/27/2014  . Impetigo 11/27/2014  . Elevated blood pressure 11/27/2014   Lavinia SharpsStacy Simpson, PT 09/09/18 12:56 PM Phone: [phone] Fax: [phone]  Vivien PrestoSimpson, Stacy C 09/09/2018, 12:56 PM  [facility] [address] [place], [place], [address] Phone: [phone]   Fax:  [phone]  Name: Erica Liu MRN: [id number] Date of Birth: 04-11-1946

## 2018-09-10 ENCOUNTER — Encounter: Payer: Self-pay | Admitting: Physical Therapy

## 2018-09-14 ENCOUNTER — Ambulatory Visit: Payer: 59

## 2018-09-23 ENCOUNTER — Ambulatory Visit (INDEPENDENT_AMBULATORY_CARE_PROVIDER_SITE_OTHER): Payer: BLUE CROSS/BLUE SHIELD | Admitting: Cardiovascular Disease

## 2018-09-23 ENCOUNTER — Encounter: Payer: Self-pay | Admitting: Cardiovascular Disease

## 2018-09-23 VITALS — BP 140/82 | HR 78 | Ht 65.5 in | Wt 159.8 lb

## 2018-09-23 DIAGNOSIS — I428 Other cardiomyopathies: Secondary | ICD-10-CM | POA: Diagnosis not present

## 2018-09-23 DIAGNOSIS — I350 Nonrheumatic aortic (valve) stenosis: Secondary | ICD-10-CM

## 2018-09-23 MED ORDER — ASPIRIN EC 81 MG PO TBEC: 81.0000 mg | DELAYED_RELEASE_TABLET | Freq: Every day | ORAL | 3 refills | Status: AC

## 2018-09-23 NOTE — Progress Notes (Signed)
Chief Complaint  Patient presents with  . New Patient (Initial Visit)    LV systolic dysfunction   History of Present Illness: 73 yo female with history of HTN, mild LV systolic dysfunction, mild aortic stenosis, mild mitral regurgitation here today as a new patient for the evaluation of reduced LV systolic function. Echo October 2019 arranged in primary care because of a cardiac murmur showed LVEF=45-50% with no regional wall motion abnormalities. There was mild to moderate aortic valve stenosis and mild mitral regurgitation. The mean gradient across the aortic valve is 12 mmHg. AVA 1.1 cm2. Dimensionless index 0.3. She tells me today that she has had no chest pain, dyspnea, LE edema, dizziness, near syncope or syncope. No recent palpitations. Weight has been stable. No prior EKG in our system.   Primary Care Physician: Orland Mustard, MD  Past Medical History:  Diagnosis Date  . Arthritis    knee?  . Elevated blood pressure 11/27/2014  . H/O miscarriage, not currently pregnant    2  . History of chicken pox   . Impetigo 11/27/2014  . Osteopenia 06/29/2018   dexa 06/2018. t-score of -1.6. FRAX: 1.7/10.4. Repeat 3 years.   . Systolic CHF (HCC) 07/01/2018   Echo 06/2018. Started on acei/beta blocker. Referred to cards. EF 45-50%    Past Surgical History:  Procedure Laterality Date  . TONSILLECTOMY  1953    Current Outpatient Medications  Medication Sig Dispense Refill  . carvedilol (COREG) 3.125 MG tablet TAKE 1 TABLET BY MOUTH TWICE A DAY WITH A MEAL 180 tablet 1  . lisinopril (PRINIVIL,ZESTRIL) 10 MG tablet Take 1 tablet (10 mg total) by mouth daily. 90 tablet 1  . aspirin EC 81 MG tablet Take 1 tablet (81 mg total) by mouth daily. 90 tablet 3   No current facility-administered medications for this visit.     Allergies  Allergen Reactions  . Neomycin Anaphylaxis    Social History   Socioeconomic History  . Marital status: Married    Spouse name: Not on file  .  Number of children: 4  . Years of education: Not on file  . Highest education level: Not on file  Occupational History  . 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.     Comment: teacher asst  Social Needs  . Financial resource strain: Not on file  . Food insecurity:    Worry: Not on file    Inability: Not on file  . Transportation needs:    Medical: Not on file    Non-medical: Not on file  Tobacco Use  . Smoking status: Former Smoker    Packs/day: 0.50    Years: 15.00    Pack years: 7.50    Last attempt to quit: 11/20/1988    Years since quitting: 29.8  . Smokeless tobacco: Never Used  Substance and Sexual Activity  . Alcohol use: Yes    Alcohol/week: 0.0 standard drinks    Comment: occasional  . Drug use: No  . Sexual activity: Yes    Birth control/protection: Post-menopausal  Lifestyle  . Physical activity:    Days per week: Not on file    Minutes per session: Not on file  . Stress: Not on file  Relationships  . Social connections:    Talks on phone: Not on file    Gets together: Not on file    Attends religious service: Not on file    Active member of club or organization: Not on file    Attends  meetings of clubs or organizations: Not on file    Relationship status: Not on file  . Intimate partner violence:    Fear of current or ex partner: Not on file    Emotionally abused: Not on file    Physically abused: Not on file    Forced sexual activity: Not on file  Other Topics Concern  . Not on file  Social History Narrative   Ms. Sundell is from [place]. She is divorced & remarried. Lives w/her husband, 2 grandsons, & daughter. 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 Heart disease Mother 65  . Hypertension Mother   . Hyperlipidemia Mother   . Cancer Mother   . Heart attack Father 44  . Diabetes Sister   . Arthritis Daughter        RA  . Alcohol abuse Son   . Cancer Maternal Grandmother   . Heart attack Maternal  Grandfather   . Early death Paternal Grandmother     Review of Systems:  As stated in the HPI and otherwise negative.   BP 140/82   Pulse 78   Ht 5' 5.5" (1.664 m)   Wt 159 lb 12.8 oz (72.5 kg)   LMP  (LMP Unknown)   SpO2 99%   BMI 26.19 kg/m   Physical Examination: General: Well developed, well nourished, NAD  HEENT: OP clear, mucus membranes moist  SKIN: warm, dry. No rashes. Neuro: No focal deficits  Musculoskeletal: Muscle strength 5/5 all ext  Psychiatric: Mood and affect normal  Neck: No JVD, no carotid bruits, no thyromegaly, no lymphadenopathy.  Lungs:Clear bilaterally, no wheezes, rhonci, crackles Cardiovascular: Regular rate and rhythm. Systolic murmur noted.  Abdomen:Soft. Bowel sounds present. Non-tender.  Extremities: No lower extremity edema. Pulses are 2 + in the bilateral DP/PT.  Echo 06/29/18: Left ventricle: The cavity size was normal. Wall thickness was   increased in a pattern of mild LVH. Systolic function was mildly   reduced. The estimated ejection fraction was in the range of 45%   to 50%. Wall motion was normal; there were no regional wall   motion abnormalities. - Ventricular septum: Septal motion showed abnormal function and   dyssynergy with conduction delay on monitor strip - Aortic valve: Cusp separation was mildly reduced. There was mild   stenosis. Valve area (VTI): 1.13 cm^2. Valve area (Vmax): 1.13   cm^2. Valve area (Vmean): 1.05 cm^2. - Mitral valve: Mildly to moderately calcified annulus. Mildly   thickened leaflets . There was mild regurgitation. Valve area by   pressure half-time: 1.64 cm^2. Valve area by continuity equation   (using LVOT flow): 1.79 cm^2.  EKG:  EKG is ordered today. The ekg ordered today demonstrates NSR, rate 78 bpm. LBBB  Recent Labs: 05/31/2018: ALT 17; BUN 14; Creatinine, Ser 0.75; Hemoglobin 13.2; Platelets 282.0; Potassium 4.2; Sodium 140; TSH 2.32   Lipid Panel    Component Value Date/Time   CHOL 240  (H) 05/31/2018 1046   TRIG 129.0 05/31/2018 1046   HDL 55.60 05/31/2018 1046   CHOLHDL 4 05/31/2018 1046   VLDL 25.8 05/31/2018 1046   LDLCALC 159 (H) 05/31/2018 1046     Wt Readings from Last 3 Encounters:  09/23/18 159 lb 12.8 oz (72.5 kg)  07/15/18 163 lb 12.8 oz (74.3 kg)  05/31/18 172 lb 12.8 oz (78.4 kg)     Other studies Reviewed: Additional studies/ records that were reviewed today include:  Review  of the above records demonstrates:    Assessment and Plan:   1. Non-ischemic cardiomyopathy: Mild LV systolic dysfunction. Her BP does not seem to have been an issue over the past few years. I do not think her valve disease is contributory. She has no signs or symptoms of angina. Will arrange a gated cardiac CTA to exclude CAD.   2. Aortic stenosis: Mild to moderate aortic stenosis by echo. She is asymptomatic. Will follow with repeat echo in one year. I have reviewed the natural history of aortic stenosis and symptoms associated with this disease process.   3. LBBB: No old EKG for comparison. She has no chest pain or dizziness.   Current medicines are reviewed at length with the patient today.  The patient does not have concerns regarding medicines.  The following changes have been made:  no change  Labs/ tests ordered today include:   Orders Placed This Encounter  Procedures  . CT CORONARY MORPH W/CTA COR W/SCORE W/CA W/CM &/OR WO/CM  . CT CORONARY FRACTIONAL FLOW RESERVE DATA PREP  . CT CORONARY FRACTIONAL FLOW RESERVE FLUID ANALYSIS  . EKG 12-Lead     Disposition:   FU with me in 3 months   Signed, Verne Carrowhristopher , MD 09/23/2018 10:59 AM    [facility] [address], [place], [place]  [address] Phone: [phone]; Fax: [phone]

## 2018-09-23 NOTE — Patient Instructions (Addendum)
Medication Instructions:  Your physician has recommended you make the following change in your medication: Start aspirin 81 mg by mouth daily.     If you need a refill on your cardiac medications before your next appointment, please call your pharmacy.   Lab work: Have lab work done at primary care in 5-6 weeks.  --BMP.  To be done before cardiac CT   If you have labs (blood work) drawn today and your tests are completely normal, you will receive your results only by: Marland Kitchen MyChart Message (if you have MyChart) OR . A paper copy in the mail If you have any lab test that is abnormal or we need to change your treatment, we will call you to review the results.  Testing/Procedures: Your physician has requested that you have cardiac CT. Cardiac computed tomography (CT) is a painless test that uses an x-ray machine to take clear, detailed pictures of your heart. For further information please visit [email or web address] Please follow instruction sheet as given.    Follow-Up: Your physician recommends that you schedule a follow-up appointment in: about 3 months.--Scheduled for April 20,2020 at 10:20 with Dr. Clifton James   Please arrive at the [facility] main entrance of [facility] at  AM (30-45 minutes prior to test start time).  We will call you with appointment information  [facility] [address] [place], [place] [address] [phone]  Proceed to the [facility] Radiology Department (First Floor).  Please follow these instructions carefully (unless otherwise directed):    On the Night Before the Test: . Be sure to Drink plenty of water. . Do not consume any caffeinated/decaffeinated beverages or chocolate 12 hours prior to your test. . Do not take any antihistamines 12 hours prior to your test.   On the Day of the Test: . Drink plenty of water. Do not drink any water within one hour of the test. . Do not eat any food 4 hours prior to the test. . You may  take your regular medications prior to the test.  . Take your morning dose of Coreg two hours prior to the test    *     After the Test: . Drink plenty of water. . After receiving IV contrast, you may experience a mild flushed feeling. This is normal. . On occasion, you may experience a mild rash up to 24 hours after the test. This is not dangerous. If this occurs, you can take Benadryl 25 mg and increase your fluid intake. . If you experience trouble breathing, this can be serious. If it is severe call 911 IMMEDIATELY. If it is mild, please call our office. . If you take any of these medications: Glipizide/Metformin, Avandament, Glucavance, please do not take 48 hours after completing test.

## 2018-09-28 ENCOUNTER — Ambulatory Visit: Payer: BLUE CROSS/BLUE SHIELD | Attending: Family Medicine | Admitting: Physical Therapy

## 2018-09-28 DIAGNOSIS — M6281 Muscle weakness (generalized): Secondary | ICD-10-CM | POA: Diagnosis present

## 2018-09-28 DIAGNOSIS — M542 Cervicalgia: Secondary | ICD-10-CM | POA: Diagnosis not present

## 2018-09-28 NOTE — Therapy (Addendum)
[facility] [address], [address] [place], [place], [address] Phone: [phone]   Fax:  [phone]  Physical Therapy Treatment/Recertification  Patient Details  Name: Erica Liu MRN: [id number] Date of Birth: 09/09/1946 Referring Provider (PT): Allison Wolfe   Encounter Date: 09/28/2018  PT End of Session - 09/28/18 1340    Visit Number  8    Number of Visits  12    Date for PT Re-Evaluation  09/14/18    Authorization Type  [employer]     PT Start Time  0945   pt late and then wanted to discuss her bill    PT Stop Time  1020    PT Time Calculation (min)  35 min    Activity Tolerance  Patient tolerated treatment well       Past Medical History:  Diagnosis Date  . Arthritis    knee?  . Elevated blood pressure 11/27/2014  . H/O miscarriage, not currently pregnant    2  . History of chicken pox   . Impetigo 11/27/2014  . Osteopenia 06/29/2018   dexa 06/2018. t-score of -1.6. FRAX: 1.7/10.4. Repeat 3 years.   . Systolic CHF (HCC) 07/01/2018   Echo 06/2018. Started on acei/beta blocker. Referred to cards. EF 45-50%    Past Surgical History:  Procedure Laterality Date  . TONSILLECTOMY  1953    There were no vitals filed for this visit.  Subjective Assessment - 09/28/18 0943    Subjective  Patient arrives late and then expresses concern about her bill.    Patient states she has a vibration sensation and burning in her neck when she walks at a faster pace.  Patient brings in a copy of her MRI with lots of notes.  Saw cardiologist and he felt this problem was not heart related.      Diagnostic tests  + Xray showing moderate degeneration in c-spine C5-6    Currently in Pain?  No/denies    Pain Score  0-No pain   felt a little vibration in my neck earlier this morning        [facility] PT Assessment - 09/28/18 0001      Assessment   Medical Diagnosis  Neck Pain    Referring Provider (PT)  Allison Wolfe    Prior Therapy   yes, 3 visits for neck      AROM   Cervical Flexion  55    Cervical Extension  65    Cervical - Right Side Bend  25    Cervical - Left Side Bend  50    Cervical - Right Rotation  42    Cervical - Left Rotation  42      Strength   Overall Strength Comments  cervical musculature 4-/5                    [facility] Adult PT Treatment/Exercise - 09/28/18 0001      Self-Care   Posture  spinal elongation and postural correction when on her walks    Other Self-Care Comments   instruction in home TENs unit basic set up       Therapeutic Activites    ADL's  neuroscience of pain education       Manual Therapy   Soft tissue mobilization  suboccipitals, paraspinals, upper traps. Elongation and trigger point release to Lt>Rt    Manual Traction  5 x 10 sec     Muscle Energy Technique  uppper trap contract   relax 3x 5 sec                PT Short Term Goals - 09/09/18 1255      PT SHORT TERM GOAL #1   Title  Pt to be indepenent with initial HEP for neck pain and posture     Status  Achieved      PT SHORT TERM GOAL #2   Title  Pt to report decreased pain in cervical region to 3/10 with activity     Status  Achieved        PT Long Term Goals - 09/28/18 1357      PT LONG TERM GOAL #1   Title  Pt to be independent wtih final HEP for neck pain and postural/UE strengthening     Time  8    Period  Weeks    Status  On-going    Target Date  11/23/18      PT LONG TERM GOAL #2   Title  Pt to report decreased pain in cervical region to 0-2/10 with activity    Time  8    Period  Weeks    Status  On-going      PT LONG TERM GOAL #3   Title  Pt to demo increased strength of shoulders and scapular region to at least 4/5, to improve stability, pain, and to decrease compensation with upper traps.     Time  8    Period  Weeks    Status  On-going      PT LONG TERM GOAL #4   Title  Pt to demo ability to self correct posture,90% of the time in clinic.     Time  8    Period   Weeks    Status  On-going            Plan - 09/28/18 1341    Clinical Impression Statement  The patient report she has been stressed about her heart but states that her cardiologist said her issues were "mild" and were not related to the vibration sensations she has been experiencing in her neck.  She reports she has been using the internet to look up information about what is causing her neck pain.  Neuroscience of pain education regarding central sensitization provided using stories and analogies.   She agrees that her high stress level may be a contributing factor to her pain.    Part of the treatment session used for manual therapy to cervical spine and including trigger point release in levator scap muscles.  She reports compression of the trigger point produces elbow numbness/tingling.  She has a home TENS unit which had not been opened and used yet.  Gave a brief instruction on donning/doffing her home TENS unit for pain control.  She would like to continue with PT and she would benefit from continued to PT for additional manual therapy, a progression of exercise and additional education.      Rehab Potential  Good    PT Frequency  1x / week    PT Duration  8 weeks    PT Treatment/Interventions  ADLs/Self Care Home Management;Cryotherapy;Electrical Stimulation;Iontophoresis 37m/ml Dexamethasone;Moist Heat;Therapeutic activities;Functional mobility training;Ultrasound;Traction;Therapeutic exercise;Neuromuscular re-education;Patient/family education;Dry needling;Passive range of motion;Manual techniques;Taping;Spinal Manipulations;Joint Manipulations    PT Next Visit Plan  add prone head lift/arm lift to HEP;  manual therapy to trigger points;   she may want limited DN;  pain education     PT [place]  Patient will benefit from skilled therapeutic intervention in order to improve the following deficits and impairments:  Decreased activity tolerance, Decreased  strength, Pain, Increased muscle spasms, Decreased mobility, Decreased range of motion, Improper body mechanics, Postural dysfunction, Impaired flexibility  Visit Diagnosis: Cervicalgia - Plan: PT plan of care cert/re-cert  Muscle weakness (generalized) - Plan: PT plan of care cert/re-cert     Problem List Patient Active Problem List   Diagnosis Date Noted  . Systolic CHF ([place]) 97/67/3419  . Osteopenia 06/29/2018  . Breast cancer screening 11/27/2014  . Colon cancer screening 11/27/2014  . Impetigo 11/27/2014  . Elevated blood pressure 11/27/2014   Ruben Im, PT 09/28/18 2:01 PM Phone: [phone] Fax: [phone] Alvera Singh 09/28/2018, 2:00 PM  [facility] [address] [place], [place], [address] Phone: [phone]   Fax:  [phone]  Name: Erica Liu MRN: [id number] Date of Birth: [birth year]   PHYSICAL THERAPY DISCHARGE SUMMARY  Visits from Start of Care: 8  Plan: Patient agrees to discharge.  Patient goals were partially met. Patient is being discharged due to not returning since the last visit.  ?????     Lyndee Hensen, PT, DPT 1:04 PM  11/30/18

## 2018-10-21 ENCOUNTER — Ambulatory Visit: Payer: BLUE CROSS/BLUE SHIELD | Admitting: Physical Therapy

## 2018-11-29 ENCOUNTER — Ambulatory Visit: Payer: Self-pay | Admitting: Family Medicine

## 2018-12-17 ENCOUNTER — Ambulatory Visit: Payer: Self-pay | Admitting: Family Medicine

## 2018-12-29 ENCOUNTER — Telehealth: Payer: Self-pay | Admitting: Cardiovascular Disease

## 2018-12-29 NOTE — Telephone Encounter (Signed)
Returned pts call regarding her upcoming appt. LMTCB.  Erica Liu

## 2018-12-29 NOTE — Telephone Encounter (Signed)
New Message  Patient wants a nurse to call her to help her setup for virtual appointment.

## 2018-12-30 ENCOUNTER — Telehealth: Payer: Self-pay | Admitting: Cardiovascular Disease

## 2018-12-30 NOTE — Telephone Encounter (Signed)
LMTCB

## 2018-12-30 NOTE — Telephone Encounter (Signed)
Follow Up:      Returning your call from today. 

## 2018-12-31 ENCOUNTER — Telehealth: Payer: Self-pay

## 2018-12-31 NOTE — Telephone Encounter (Signed)
Follow up    Pt is returning call She does have an Iphone    Please call back

## 2018-12-31 NOTE — Telephone Encounter (Signed)
°  4/17 :  Left VM for patient to return call regarding visit on 4/20.

## 2018-12-31 NOTE — Telephone Encounter (Signed)
°  NEW MESSAGE   4/17 :  Spoke with patient.     VIDEO/Doximity visit on 01/03/2019.    Consent sent via MyChart   -  12/31/2018

## 2019-01-03 ENCOUNTER — Encounter: Payer: Self-pay | Admitting: Cardiovascular Disease

## 2019-01-03 ENCOUNTER — Other Ambulatory Visit: Payer: Self-pay

## 2019-01-03 ENCOUNTER — Telehealth: Payer: Self-pay | Admitting: Cardiovascular Disease

## 2019-01-03 ENCOUNTER — Telehealth (INDEPENDENT_AMBULATORY_CARE_PROVIDER_SITE_OTHER): Payer: BLUE CROSS/BLUE SHIELD | Admitting: Cardiovascular Disease

## 2019-01-03 VITALS — BP 153/80 | HR 82 | Ht 65.5 in | Wt 157.0 lb

## 2019-01-03 DIAGNOSIS — I350 Nonrheumatic aortic (valve) stenosis: Secondary | ICD-10-CM | POA: Diagnosis not present

## 2019-01-03 DIAGNOSIS — I428 Other cardiomyopathies: Secondary | ICD-10-CM

## 2019-01-03 DIAGNOSIS — I447 Left bundle-branch block, unspecified: Secondary | ICD-10-CM | POA: Diagnosis not present

## 2019-01-03 NOTE — Progress Notes (Signed)
Virtual Visit via Video Note   This visit type was conducted due to national recommendations for restrictions regarding the COVID-19 Pandemic (e.g. social distancing) in an effort to limit this patient's exposure and mitigate transmission in our community.  Due to her co-morbid illnesses, this patient is at least at moderate risk for complications without adequate follow up.  This format is felt to be most appropriate for this patient at this time.  All issues noted in this document were discussed and addressed.  A limited physical exam was performed with this format.  Please refer to the patient's chart for her consent to telehealth for [facility].   Evaluation Performed:  Follow-up visit  Date:  01/03/2019   ID:  Erica Liu, DOB September 01, 1946, MRN [id number]  Patient Location: Home Provider Location: Office  PCP:  Orland Mustard, MD  Cardiologist:  No primary care provider on file. Trenace Coughlin Electrophysiologist:  None   Chief Complaint:  Follow up- Cardiomyopathy  History of Present Illness:    Erica Liu is a 73 y.o. female with history of HTN, mild LV systolic dysfunction, mild aortic stenosis, mild mitral regurgitation and LBBB who is being seen today by virtual e-visit due to the Covid19 pandemic. I saw her for the first time in January 2020 for the evaluation of reduced LV systolic function. Echo October 2019 arranged in primary care because of a cardiac murmur showed LVEF=45-50% with no regional wall motion abnormalities. There was mild to moderate aortic valve stenosis and mild mitral regurgitation. The mean gradient across the aortic valve is 12 mmHg. AVA 1.1 cm2. Dimensionless index 0.3. At her first office visit, she had no chest pain, dyspnea, LE edema, dizziness, near syncope or syncope.  LBBB noted on first EKG in our office. Gated cardiac CTA ordered at first visit but delayed due to the Covid19 pandemic.   She tells me today that she feels great. No chest pain,  dyspnea or LE edema.   The patient does not have symptoms concerning for COVID-19 infection (fever, chills, cough, or new shortness of breath).    Past Medical History:  Diagnosis Date  . Arthritis    knee?  . Elevated blood pressure 11/27/2014  . H/O miscarriage, not currently pregnant    2  . History of chicken pox   . Impetigo 11/27/2014  . Osteopenia 06/29/2018   dexa 06/2018. t-score of -1.6. FRAX: 1.7/10.4. Repeat 3 years.   . Systolic CHF (HCC) 07/01/2018   Echo 06/2018. Started on acei/beta blocker. Referred to cards. EF 45-50%   Past Surgical History:  Procedure Laterality Date  . TONSILLECTOMY  1953     Current Meds  Medication Sig  . aspirin EC 81 MG tablet Take 1 tablet (81 mg total) by mouth daily.  . carvedilol (COREG) 3.125 MG tablet TAKE 1 TABLET BY MOUTH TWICE A DAY WITH A MEAL  . lisinopril (PRINIVIL,ZESTRIL) 10 MG tablet Take 1 tablet (10 mg total) by mouth daily.     Allergies:   Neomycin   Social History   Tobacco Use  . Smoking status: Former Smoker    Packs/day: 0.50    Years: 15.00    Pack years: 7.50    Last attempt to quit: 11/20/1988    Years since quitting: 30.1  . Smokeless tobacco: Never Used  Substance Use Topics  . Alcohol use: Yes    Alcohol/week: 0.0 standard drinks    Comment: occasional  . Drug use: No  Family Hx: The patient's family history includes Alcohol abuse in her son; Arthritis in her daughter; Cancer in her maternal grandmother and mother; Diabetes in her sister; Early death in her paternal grandmother; Heart attack in her maternal grandfather; Heart attack (age of onset: 64) in her father; Heart disease (age of onset: 51) in her mother; Hyperlipidemia in her mother; Hypertension in her mother.  ROS:   Please see the history of present illness.    All other systems reviewed and are negative.   Prior CV studies:   The following studies were reviewed today:  Echo 06/29/18: Left ventricle: The cavity size was  normal. Wall thickness was increased in a pattern of mild LVH. Systolic function was mildly reduced. The estimated ejection fraction was in the range of 45% to 50%. Wall motion was normal; there were no regional wall motion abnormalities. - Ventricular septum: Septal motion showed abnormal function and dyssynergy with conduction delay on monitor strip - Aortic valve: Cusp separation was mildly reduced. There was mild stenosis. Valve area (VTI): 1.13 cm^2. Valve area (Vmax): 1.13 cm^2. Valve area (Vmean): 1.05 cm^2. - Mitral valve: Mildly to moderately calcified annulus. Mildly thickened leaflets . There was mild regurgitation. Valve area by pressure half-time: 1.64 cm^2. Valve area by continuity equation (using LVOT flow): 1.79 cm^2.  Labs/Other Tests and Data Reviewed:    EKG:  No ECG reviewed.  Recent Labs: 05/31/2018: ALT 17; BUN 14; Creatinine, Ser 0.75; Hemoglobin 13.2; Platelets 282.0; Potassium 4.2; Sodium 140; TSH 2.32   Recent Lipid Panel Lab Results  Component Value Date/Time   CHOL 240 (H) 05/31/2018 10:46 AM   TRIG 129.0 05/31/2018 10:46 AM   HDL 55.60 05/31/2018 10:46 AM   CHOLHDL 4 05/31/2018 10:46 AM   LDLCALC 159 (H) 05/31/2018 10:46 AM    Wt Readings from Last 3 Encounters:  01/03/19 157 lb (71.2 kg)  09/23/18 159 lb 12.8 oz (72.5 kg)  07/15/18 163 lb 12.8 oz (74.3 kg)     Objective:    Vital Signs:  BP (!) 153/80   Pulse 82   Ht 5' 5.5" (1.664 m)   Wt 157 lb (71.2 kg)   LMP  (LMP Unknown)   BMI 25.73 kg/m    VITAL SIGNS:  reviewed GEN:  no acute distress  ASSESSMENT & PLAN:    1. Non-ischemic cardiomyopathy: Mild LV systolic dysfunction by echo in 2019 with no regional wall motion abnormalities. . I arranged a gated cardiac CTA but this has been delayed due tot he Covid19 pandemic.    2. Aortic stenosis: Mild to moderate aortic stenosis by echo in October 2019. Marland Kitchen She has no symptoms of AS. Will repeat echo in our office in  October 2020.   3. LBBB: Likely old but no EKG prior to January 2020.    COVID-19 Education: The signs and symptoms of COVID-19 were discussed with the patient and how to seek care for testing (follow up with PCP or arrange E-visit).  The importance of social distancing was discussed today.  Time:   Today, I have spent 15 minutes with the patient with telehealth technology discussing the above problems.     Medication Adjustments/Labs and Tests Ordered: Current medicines are reviewed at length with the patient today.  Concerns regarding medicines are outlined above.   Tests Ordered: Orders Placed This Encounter  Procedures  . ECHOCARDIOGRAM COMPLETE    Medication Changes: No orders of the defined types were placed in this encounter.   Disposition:  Follow  up in 8 month(s)  Signed, Verne Carrowhristopher Jemiah Ellenburg, MD  01/03/2019 10:53 AM    [facility]

## 2019-01-03 NOTE — Telephone Encounter (Signed)
CMA spoke with patient and did rooming piece of virtual visit for today.

## 2019-01-03 NOTE — Patient Instructions (Addendum)
Medication Instructions:  Your physician recommends that you continue on your current medications as directed. Please refer to the Current Medication list given to you today.  If you need a refill on your cardiac medications before your next appointment, please call your pharmacy.   Lab work: None Ordered  If you have labs (blood work) drawn today and your tests are completely normal, you will receive your results only by: Marland Kitchen MyChart Message (if you have MyChart) OR . A paper copy in the mail If you have any lab test that is abnormal or we need to change your treatment, we will call you to review the results.  Testing/Procedures: Your physician has requested that you have an echocardiogram in OCTOBER. Echocardiography is a painless test that uses sound waves to create images of your heart. It provides your doctor with information about the size and shape of your heart and how well your heart's chambers and valves are working. This procedure takes approximately one hour. There are no restrictions for this procedure.   Follow-Up: At [facility], you and your health needs are our priority.  As part of our continuing mission to provide you with exceptional heart care, we have created designated Provider Care Teams.  These Care Teams include your primary Cardiologist (physician) and Advanced Practice Providers (APPs -  Physician Assistants and Nurse Practitioners) who all work together to provide you with the care you need, when you need it. . You will need a follow up appointment in November or December.  Please call our office 2 months in advance to schedule this appointment.  You may see Earney Hamburg, MD or one of the following Advanced Practice Providers on your designated Care Team:   . Robbie Lis, PA-C . Dayna Dunn, PA-C . Jacolyn Reedy, PA-C  Any Other Special Instructions Will Be Listed Below (If Applicable).

## 2019-01-03 NOTE — Telephone Encounter (Signed)
Follow UP;    Pt returning call from this morning.

## 2019-01-28 ENCOUNTER — Ambulatory Visit: Payer: BLUE CROSS/BLUE SHIELD | Admitting: Family Medicine

## 2019-02-03 ENCOUNTER — Other Ambulatory Visit: Payer: Self-pay | Admitting: Family Medicine

## 2019-02-11 ENCOUNTER — Encounter: Payer: Self-pay | Admitting: Family Medicine

## 2019-02-11 ENCOUNTER — Other Ambulatory Visit: Payer: Self-pay

## 2019-02-11 ENCOUNTER — Ambulatory Visit (INDEPENDENT_AMBULATORY_CARE_PROVIDER_SITE_OTHER): Payer: BLUE CROSS/BLUE SHIELD | Admitting: Family Medicine

## 2019-02-11 VITALS — BP 128/86 | HR 75 | Temp 98.1°F | Ht 65.55 in | Wt 160.4 lb

## 2019-02-11 DIAGNOSIS — I10 Essential (primary) hypertension: Secondary | ICD-10-CM | POA: Diagnosis not present

## 2019-02-11 LAB — CBC WITH DIFFERENTIAL/PLATELET
Basophils Absolute: 0.1 10*3/uL (ref 0.0–0.1)
Basophils Relative: 1.2 % (ref 0.0–3.0)
Eosinophils Absolute: 0.4 10*3/uL (ref 0.0–0.7)
Eosinophils Relative: 5.6 % — ABNORMAL HIGH (ref 0.0–5.0)
HCT: 36.4 % (ref 36.0–46.0)
Hemoglobin: 12.4 g/dL (ref 12.0–15.0)
Lymphocytes Relative: 32.4 % (ref 12.0–46.0)
Lymphs Abs: 2.4 10*3/uL (ref 0.7–4.0)
MCHC: 34.1 g/dL (ref 30.0–36.0)
MCV: 91.2 fl (ref 78.0–100.0)
Monocytes Absolute: 0.6 10*3/uL (ref 0.1–1.0)
Monocytes Relative: 7.5 % (ref 3.0–12.0)
Neutro Abs: 4 10*3/uL (ref 1.4–7.7)
Neutrophils Relative %: 53.3 % (ref 43.0–77.0)
Platelets: 282 10*3/uL (ref 150.0–400.0)
RBC: 4 Mil/uL (ref 3.87–5.11)
RDW: 12.8 % (ref 11.5–15.5)
WBC: 7.5 10*3/uL (ref 4.0–10.5)

## 2019-02-11 LAB — COMPREHENSIVE METABOLIC PANEL
ALT: 12 U/L (ref 0–35)
AST: 20 U/L (ref 0–37)
Albumin: 4.2 g/dL (ref 3.5–5.2)
Alkaline Phosphatase: 71 U/L (ref 39–117)
BUN: 18 mg/dL (ref 6–23)
CO2: 29 mEq/L (ref 19–32)
Calcium: 9.7 mg/dL (ref 8.4–10.5)
Chloride: 103 mEq/L (ref 96–112)
Creatinine, Ser: 0.76 mg/dL (ref 0.40–1.20)
GFR: 74.8 mL/min (ref >60.00)
Glucose, Bld: 84 mg/dL (ref 70–99)
Potassium: 4.5 mEq/L (ref 3.5–5.1)
Sodium: 139 mEq/L (ref 135–145)
Total Bilirubin: 0.4 mg/dL (ref 0.2–1.2)
Total Protein: 7.4 g/dL (ref 6.0–8.3)

## 2019-02-11 LAB — MICROALBUMIN / CREATININE URINE RATIO
Creatinine,U: 13.5 mg/dL
Microalb Creat Ratio: 5.2 mg/g (ref 0.0–30.0)
Microalb, Ur: <0.7 mg/dL (ref 0.0–1.9)

## 2019-02-11 MED ORDER — LISINOPRIL 10 MG PO TABS: ORAL_TABLET | ORAL | 3 refills | Status: DC

## 2019-02-11 NOTE — Progress Notes (Signed)
Patient: Erica Liu MRN: [id number] DOB: Aug 02, 1946 PCP: Orland Mustard, MD     Subjective:  Chief Complaint  Patient presents with  . Hypertension    HPI: The patient is a 73 y.o. female who presents today for hypertension follow up.   Hypertension: Here for follow up of hypertension.  Currently on lisinopril and coreg . I started coreg for CHF.  Home readings range from 130-150 systolic/80 diastolic. Takes medication as prescribed and denies any side effects. Exercise includes walking. Weight has been stable. Denies any chest pain, headaches, shortness of breath, vision changes, swelling in lower extremities. She is still keen to get off medication. Again, I explained that both medications are for CHF as well and will likely be long term.   -requesting handicap parking placard due to bad OA in knees and pain with long walking. Still not interested in any kind of intervention.     Review of Systems  Constitutional: Negative for fatigue.  Eyes: Negative for visual disturbance.  Respiratory: Negative for shortness of breath.   Cardiovascular: Negative for chest pain and palpitations.  Gastrointestinal: Negative for abdominal pain and rectal pain.  Neurological: Negative for dizziness and headaches.  Psychiatric/Behavioral: Negative for sleep disturbance.    Allergies Patient is allergic to neomycin.  Past Medical History Patient  has a past medical history of Arthritis, Elevated blood pressure (11/27/2014), H/O miscarriage, not currently pregnant, History of chicken pox, Impetigo (11/27/2014), Osteopenia (06/29/2018), and Systolic CHF (HCC) (07/01/2018).  Surgical History Patient  has a past surgical history that includes Tonsillectomy (1953).  Family History Pateint's family history includes Alcohol abuse in her son; Arthritis in her daughter; Cancer in her maternal grandmother and mother; Diabetes in her sister; Early death in her paternal grandmother; Heart attack in her  maternal grandfather; Heart attack (age of onset: 8) in her father; Heart disease (age of onset: 16) in her mother; Hyperlipidemia in her mother; Hypertension in her mother.  Social History Patient  reports that she quit smoking about 30 years ago. She has a 7.50 pack-year smoking history. She has never used smokeless tobacco. She reports current alcohol use. She reports that she does not use drugs.    Objective: Vitals:   02/11/19 1108  BP: 128/86  Pulse: 75  Temp: 98.1 F (36.7 C)  TempSrc: Oral  SpO2: 99%  Weight: 160 lb 6.4 oz (72.8 kg)  Height: 5' 5.55" (1.665 m)    Body mass index is 26.25 kg/m.  Physical Exam Vitals signs reviewed.  Constitutional:      Appearance: Normal appearance.  HENT:     Head: Normocephalic and atraumatic.  Cardiovascular:     Rate and Rhythm: Normal rate and regular rhythm.     Heart sounds: Normal heart sounds.  Pulmonary:     Effort: Pulmonary effort is normal.     Breath sounds: Normal breath sounds.  Abdominal:     General: Abdomen is flat. Bowel sounds are normal.     Palpations: Abdomen is soft.  Skin:    Capillary Refill: Capillary refill takes less than 2 seconds.  Neurological:     General: No focal deficit present.     Mental Status: She is alert and oriented to person, place, and time.          Assessment/plan: 1. Benign essential HTN Blood pressure is to goal. Continue current anti-hypertensive medications. Refills given and routine lab work will be done today. Recommended routine exercise and healthy diet including DASH diet and mediterranean diet.  Encouraged weight loss. F/u in 6 months. I am keen to increase her lisinopril to 20mg  due to elevated home readings, but she is close to goal today. Will have her continue home readings and she will email me if consistently over 140/90. Again, discussed that medication is likely more a long term thing to help treat CHF and will likely not be able to come off medication.   -  Comprehensive metabolic panel - CBC with Differential/Platelet - Microalbumin / creatinine urine ratio  -filled out handicap parking placard x 6 months.   Return in about 6 months (around 08/14/2019) for annual/htn f/u .   Orland Mustard, MD [facility]   02/11/2019

## 2019-03-17 ENCOUNTER — Encounter: Payer: Self-pay | Admitting: Cardiovascular Disease

## 2019-03-25 ENCOUNTER — Other Ambulatory Visit: Payer: Self-pay | Admitting: *Deleted

## 2019-03-25 DIAGNOSIS — I428 Other cardiomyopathies: Secondary | ICD-10-CM

## 2019-03-25 NOTE — Progress Notes (Signed)
Order for previous cardiac CTA has expired. New order placed.

## 2019-04-16 ENCOUNTER — Other Ambulatory Visit: Payer: Self-pay | Admitting: Family Medicine

## 2019-06-13 ENCOUNTER — Other Ambulatory Visit: Payer: Self-pay | Admitting: *Deleted

## 2019-06-13 DIAGNOSIS — I428 Other cardiomyopathies: Secondary | ICD-10-CM

## 2019-06-13 NOTE — Progress Notes (Signed)
Order for Cardiac CTA expired. New order placed.

## 2019-06-30 ENCOUNTER — Encounter (HOSPITAL_COMMUNITY): Payer: Self-pay | Admitting: Cardiovascular Disease

## 2019-07-29 ENCOUNTER — Ambulatory Visit (HOSPITAL_COMMUNITY): Payer: BLUE CROSS/BLUE SHIELD

## 2019-08-01 ENCOUNTER — Telehealth (HOSPITAL_COMMUNITY): Payer: Self-pay

## 2019-08-01 NOTE — Telephone Encounter (Signed)
New message    Just an FYI. We have made several attempts to contact this patient including sending a letter to schedule or reschedule their echocardiogram. We will be removing the patient from the echo WQ.   11.12.20 Cancel Rsn: Patient (wait until COVID is over )  10.21.20 @ 11:33am spoke with patient out of town  - will call next week - Erica Liu   10.19.20 @ 4;02pm lm on home vm - Erica Liu  10.15.20 @ 12:01pm lm on home vm  - mail reminder letter Erica Liu  OCTOBER 2020

## 2019-08-03 ENCOUNTER — Inpatient Hospital Stay: Admission: RE | Admit: 2019-08-03 | Payer: BLUE CROSS/BLUE SHIELD | Source: Ambulatory Visit

## 2019-10-21 ENCOUNTER — Other Ambulatory Visit: Payer: Self-pay | Admitting: Family Medicine

## 2019-10-27 ENCOUNTER — Ambulatory Visit: Payer: BLUE CROSS/BLUE SHIELD | Admitting: Cardiovascular Disease

## 2019-11-06 ENCOUNTER — Other Ambulatory Visit: Payer: Self-pay | Admitting: Family Medicine

## 2019-12-28 ENCOUNTER — Ambulatory Visit: Payer: BLUE CROSS/BLUE SHIELD | Admitting: Cardiovascular Disease

## 2020-01-05 ENCOUNTER — Ambulatory Visit: Payer: BLUE CROSS/BLUE SHIELD | Admitting: Cardiovascular Disease

## 2020-03-09 ENCOUNTER — Telehealth: Payer: Self-pay | Admitting: Family Medicine

## 2020-03-09 NOTE — Telephone Encounter (Signed)
Patient asked for a call back regarding her  lisinopril (ZESTRIL) 10 MG tablet she is out of it and wants to know If she should get it refilled or if she should be put on something else

## 2020-03-13 ENCOUNTER — Other Ambulatory Visit: Payer: Self-pay

## 2020-03-13 ENCOUNTER — Other Ambulatory Visit: Payer: Self-pay | Admitting: Family Medicine

## 2020-04-02 ENCOUNTER — Encounter: Payer: BLUE CROSS/BLUE SHIELD | Admitting: Family Medicine

## 2020-04-18 ENCOUNTER — Telehealth: Payer: Self-pay | Admitting: Family Medicine

## 2020-04-18 NOTE — Telephone Encounter (Signed)
I spoke with the patient to give suggestions on what she can do as far as building her appetite. I suggested a multivitamin, and protein replacements with meals. I also advised that she take Tums due to indigestion, if any. But made her aware that unfortunately she'd have to wait it out. Pt voiced understanding.

## 2020-04-18 NOTE — Telephone Encounter (Signed)
Pt states she tested positive for covid 15 days ago. Pt is not having any other symptoms except everything tastes like metal. Pt states she has not been eating much and is having trouble eating her food because of the taste. Pt asked if there is anything Dr. Artis Flock can do for her and suggestions. Please advise.

## 2020-06-08 ENCOUNTER — Other Ambulatory Visit: Payer: Self-pay | Admitting: Family Medicine

## 2020-06-09 ENCOUNTER — Other Ambulatory Visit: Payer: Self-pay

## 2020-07-02 ENCOUNTER — Other Ambulatory Visit: Payer: Self-pay | Admitting: Family Medicine

## 2020-07-30 ENCOUNTER — Other Ambulatory Visit: Payer: Self-pay | Admitting: Family Medicine

## 2020-08-13 IMAGING — DX DG CERVICAL SPINE COMPLETE 4+V
5 series · 5 of 5 positions shown · non-contrast
Comparison: None

CLINICAL DATA: Chronic neck pain for several years. No known
injury. Initial encounter.

EXAM:
CERVICAL SPINE - COMPLETE 4+ VIEW

[cervical spine lat]
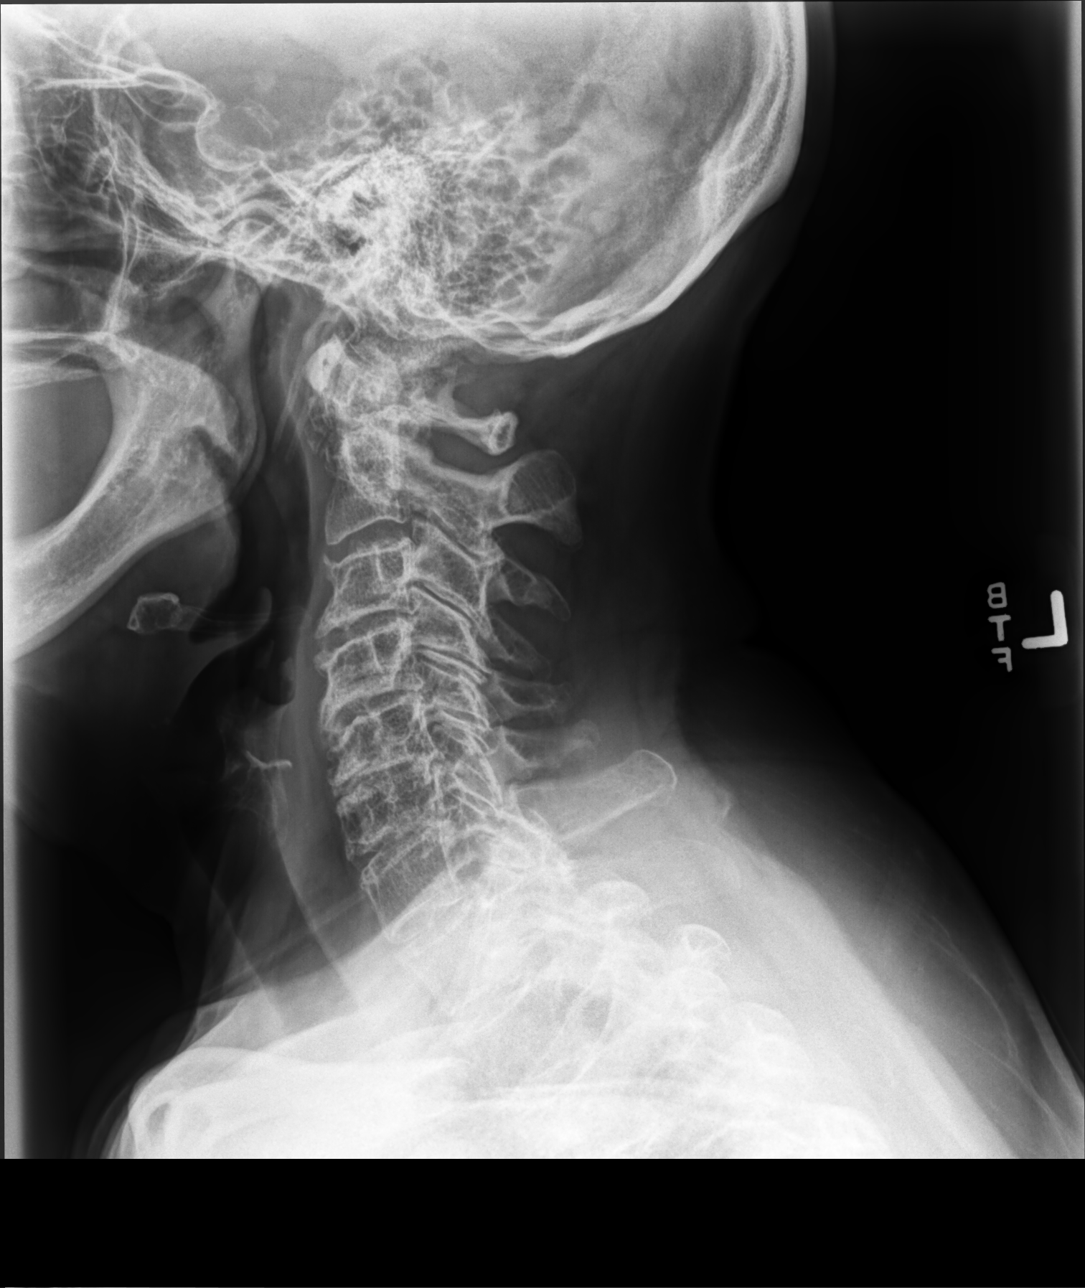

[cervical spine oblique (1 of 2)]
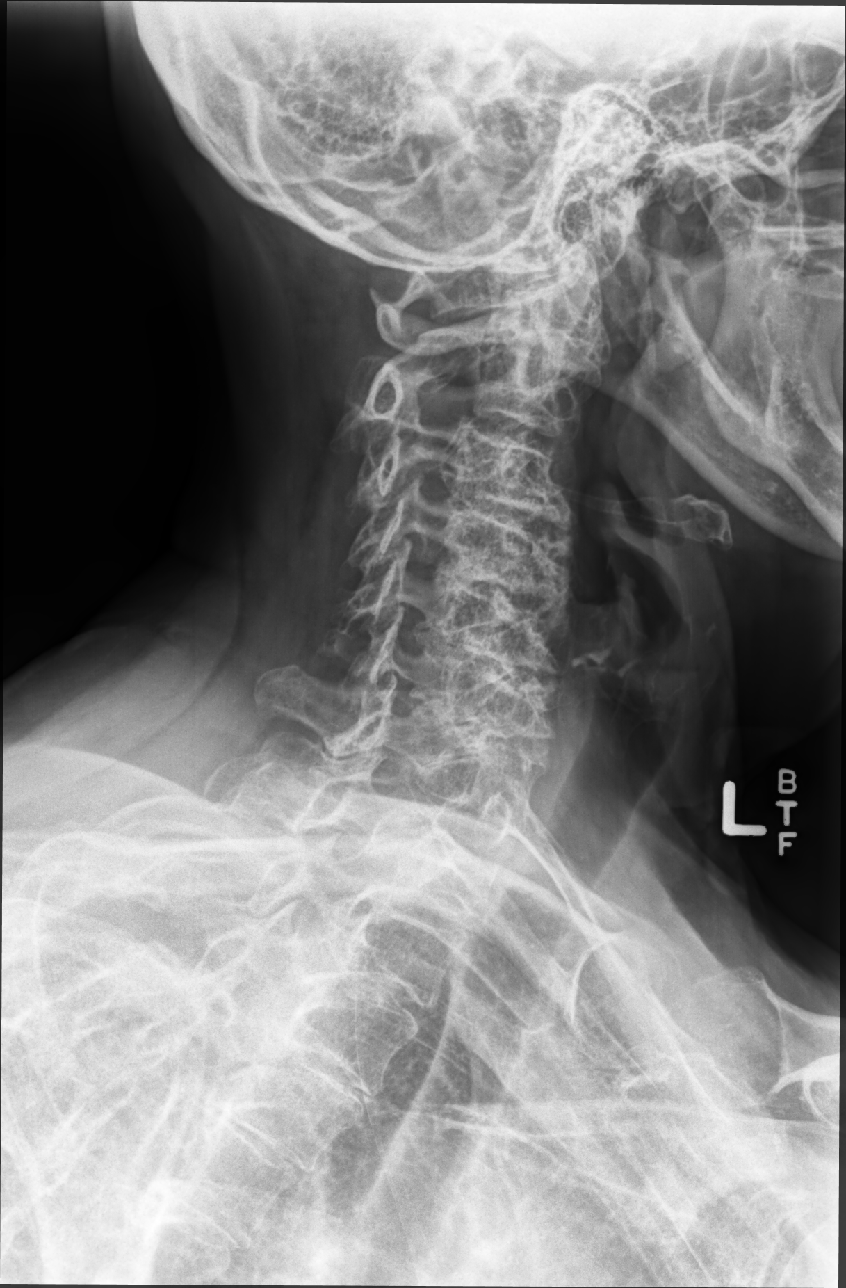

[cervical spine oblique (2 of 2)]
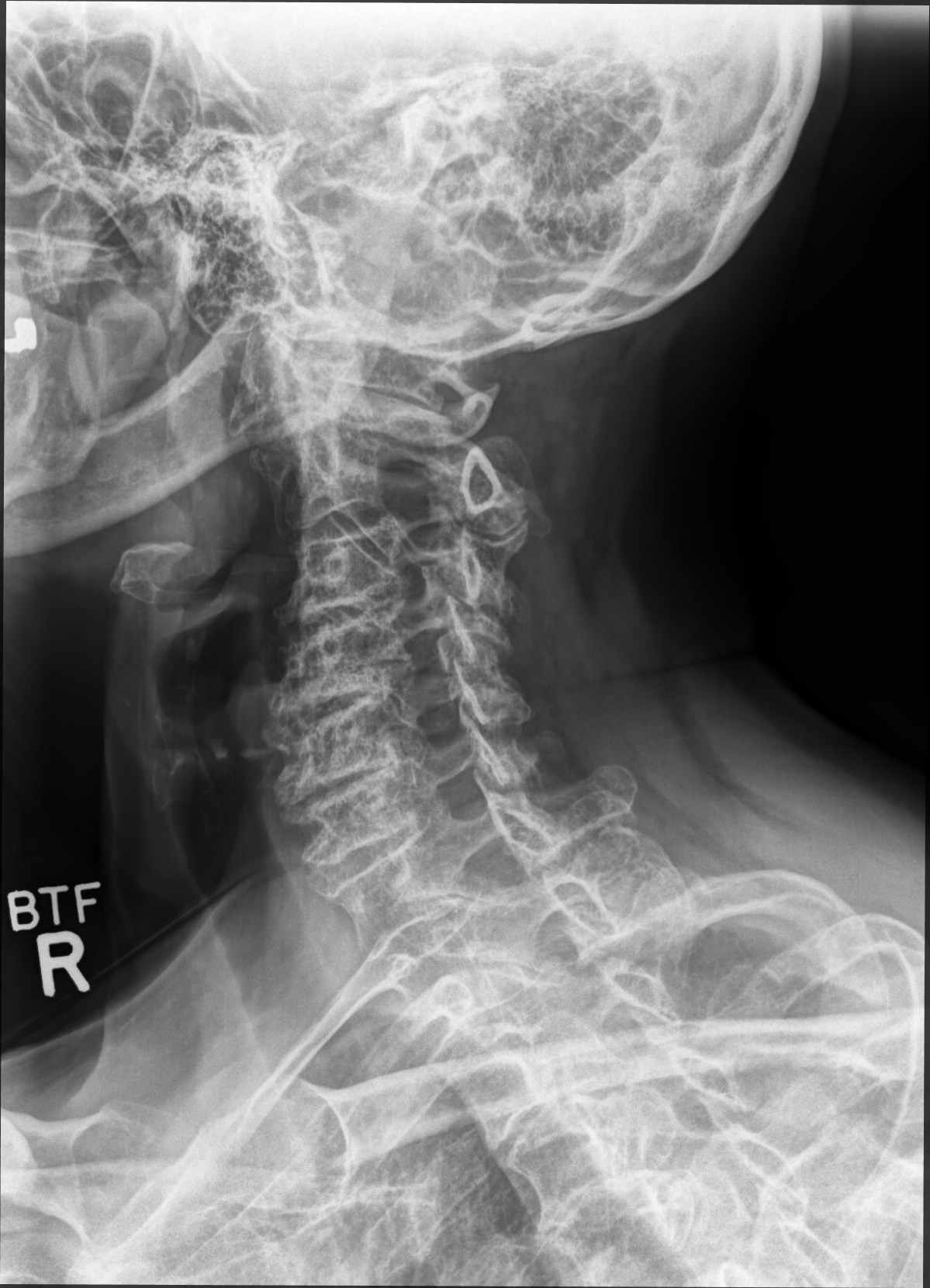

[cervical spine ap (1 of 2)]
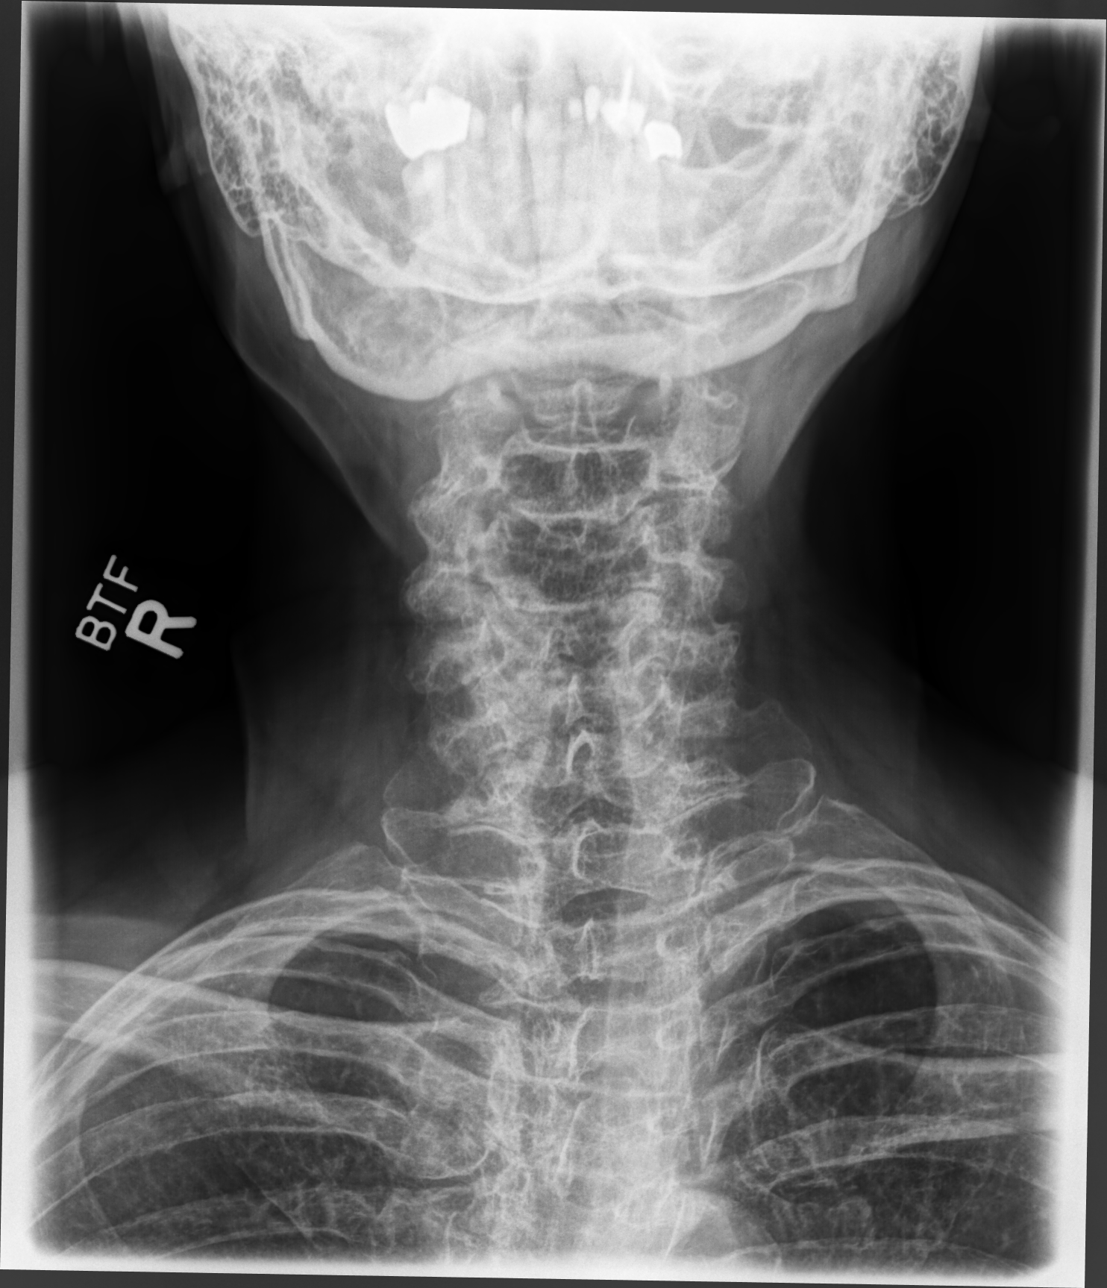

[cervical spine ap (2 of 2)]
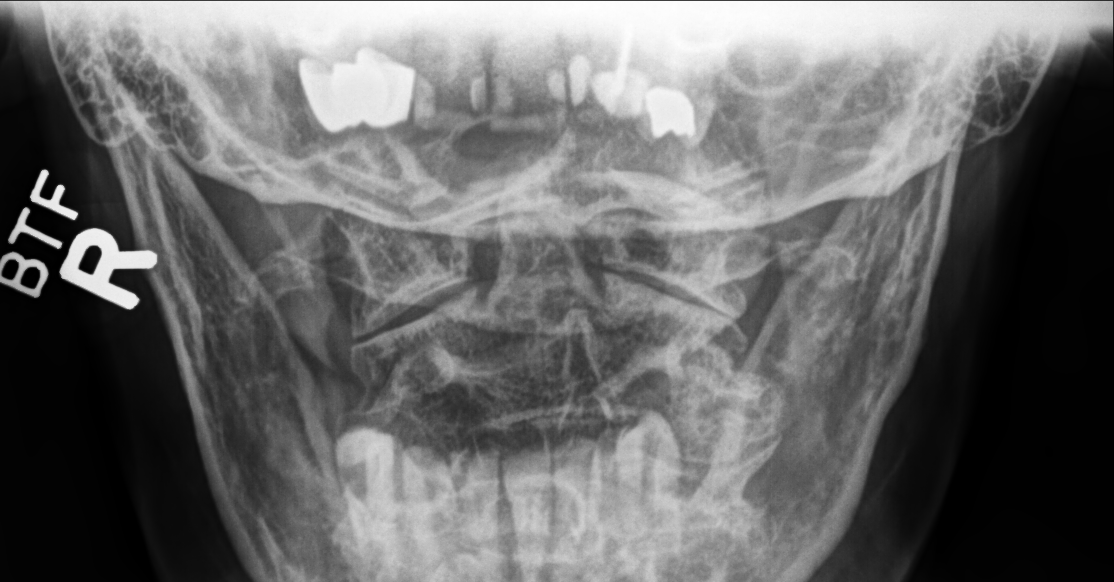

[5 of 5 positions shown; findings below may reference images not displayed]

FINDINGS: No acute fracture, subluxation or prevertebral soft tissue swelling.

Moderate degenerative disc disease/spondylosis and facet arthropathy
throughout the cervical spine noted. Mild bony foraminal narrowing
noted on the RIGHT at C5-6 and C6-7 and on the LEFT at C3-4.

No suspicious focal bony lesions identified.
IMPRESSION: Moderate degenerative changes throughout the cervical spine with
mild bony foraminal narrowing as described above.

## 2021-01-23 ENCOUNTER — Other Ambulatory Visit: Payer: Self-pay | Admitting: Family Medicine

## 2021-03-18 ENCOUNTER — Other Ambulatory Visit: Payer: Self-pay | Admitting: Family Medicine

## 2021-04-12 ENCOUNTER — Telehealth: Payer: Self-pay

## 2021-04-12 NOTE — Telephone Encounter (Signed)
Spoke to pt told her I can not refill medications since you have not been here since 2020. Asked pt if she has seen Cardiology? Pt said no, so going to let me walk around with high blood pressure. Told pt you can go to an Urgent care or call Dr. Paralee Cancel office and see if they can see you sooner. Pt said you can't just let me come in and check my blood pressure. Told pt no you need to see a provider, asked her is she has nay medications left? Pt said one enough till Monday and the other a weeks worth. Told pt I will have the schedulers call you and bring you in next week with a provider so you can get your refills for your medications. Pt verbalized understanding.

## 2021-04-12 NOTE — Telephone Encounter (Signed)
..   Encourage patient to contact the pharmacy for refills or they can request refills through [facility]  LAST APPOINTMENT DATE:  02/11/19  NEXT APPOINTMENT DATE: 06/19/21 for TOC with Neva Seat  MEDICATION:  lisinopril 10 mg and Carvedilol 3.125 mg   Is the patient out of medication?   [facility] [place]   Let patient know to contact pharmacy at the end of the day to make sure medication is ready.  Please notify patient to allow 48-72 hours to process  CLINICAL FILLS OUT ALL BELOW:   LAST REFILL:  QTY:  REFILL DATE:    OTHER COMMENTS:    Okay for refill?  Please advise

## 2021-04-17 ENCOUNTER — Ambulatory Visit (INDEPENDENT_AMBULATORY_CARE_PROVIDER_SITE_OTHER): Payer: BLUE CROSS/BLUE SHIELD | Admitting: Family Medicine

## 2021-04-17 ENCOUNTER — Encounter: Payer: Self-pay | Admitting: Family Medicine

## 2021-04-17 ENCOUNTER — Ambulatory Visit: Payer: BLUE CROSS/BLUE SHIELD | Admitting: Physician Assistant

## 2021-04-17 ENCOUNTER — Other Ambulatory Visit: Payer: Self-pay

## 2021-04-17 VITALS — BP 132/80 | HR 75 | Temp 98.1°F | Resp 18 | Ht 65.5 in | Wt 161.8 lb

## 2021-04-17 DIAGNOSIS — Z1211 Encounter for screening for malignant neoplasm of colon: Secondary | ICD-10-CM

## 2021-04-17 DIAGNOSIS — I5020 Unspecified systolic (congestive) heart failure: Secondary | ICD-10-CM | POA: Diagnosis not present

## 2021-04-17 DIAGNOSIS — I10 Essential (primary) hypertension: Secondary | ICD-10-CM

## 2021-04-17 DIAGNOSIS — E785 Hyperlipidemia, unspecified: Secondary | ICD-10-CM

## 2021-04-17 LAB — COMPREHENSIVE METABOLIC PANEL
ALT: 12 U/L (ref 0–35)
AST: 20 U/L (ref 0–37)
Albumin: 4.4 g/dL (ref 3.5–5.2)
Alkaline Phosphatase: 64 U/L (ref 39–117)
BUN: 16 mg/dL (ref 6–23)
CO2: 28 mEq/L (ref 19–32)
Calcium: 9.7 mg/dL (ref 8.4–10.5)
Chloride: 102 mEq/L (ref 96–112)
Creatinine, Ser: 0.85 mg/dL (ref 0.40–1.20)
GFR: 67.6 mL/min (ref >60.00)
Glucose, Bld: 83 mg/dL (ref 70–99)
Potassium: 4.3 mEq/L (ref 3.5–5.1)
Sodium: 139 mEq/L (ref 135–145)
Total Bilirubin: 0.5 mg/dL (ref 0.2–1.2)
Total Protein: 7.4 g/dL (ref 6.0–8.3)

## 2021-04-17 LAB — LIPID PANEL
Cholesterol: 243 mg/dL — ABNORMAL HIGH (ref 0–200)
HDL: 58.8 mg/dL (ref >39.00)
LDL Cholesterol: 161 mg/dL — ABNORMAL HIGH (ref 0–99)
NonHDL: 184.61
Total CHOL/HDL Ratio: 4
Triglycerides: 117 mg/dL (ref 0.0–149.0)
VLDL: 23.4 mg/dL (ref 0.0–40.0)

## 2021-04-17 MED ORDER — LISINOPRIL 10 MG PO TABS: ORAL_TABLET | ORAL | 1 refills | Status: DC

## 2021-04-17 MED ORDER — CARVEDILOL 3.125 MG PO TABS: 3.1250 mg | ORAL_TABLET | Freq: Two times a day (BID) | ORAL | 1 refills | Status: DC

## 2021-04-17 NOTE — Progress Notes (Signed)
Subjective:  Patient ID: Erica Liu, female    DOB: [birth year]  Age: 75 y.o. MRN: [id number]  CC:  Chief Complaint  Patient presents with   Hypertension    Pt denies physical sxs reports needs refill lisinopril while she is unable to see Dr Erica Liu, notes no concerns today     HPI Erica Liu presents for  Med refills. PCP Dr. Artis Liu.   Hypertension: With history of systolic CHF, mild LV systolic dysfunction, mild aortic stenosis, mild mitral regurgitation and left bundle branch block.  EF 45 to 50% in 2019.  No regional wall motion abnormalities.  Last cardiology eval in April 2020, plan for updated echocardiogram with a gated cardiac CTA.  Plan for repeat echo for aortic stenosis in 2020. She takes carvedilol 3.125 mg twice daily, lisinopril 10 mg daily, aspirin 81 mg daily prior - stopped taking about 6 months ago.  Due for cardiology visit. Hectic last year, back on track.  Home readings: 126-130/70-80. BP Readings from Last 3 Encounters:  04/17/21 132/80  02/11/19 128/86  01/03/19 (!) 153/80   Lab Results  Component Value Date   CREATININE 0.76 02/11/2019   Did not get covid vaccine, no questions. Decided against.  [facility]  Mammogram 2 years ago - plans to schedule.  Colonoscopy - has not had. No FH of colon CA, no personal hx of polyps/bleeding. Screening options with colonoscopy versus Cologuard discussed. Discussed timing of repeat testing intervals if normal, as well as potential need for diagnostic Colonoscopy if positive Cologuard. Understanding expressed, and chose Cologuard.    Hyperlipidemia: No meds - planned diet approach. No recent testing.  Lab Results  Component Value Date   CHOL 240 (H) 05/31/2018   HDL 55.60 05/31/2018   LDLCALC 159 (H) 05/31/2018   TRIG 129.0 05/31/2018   CHOLHDL 4 05/31/2018   Lab Results  Component Value Date   ALT 12 02/11/2019   AST 20 02/11/2019   ALKPHOS 71 02/11/2019   BILITOT 0.4 02/11/2019      History Patient Active Problem List   Diagnosis Date Noted   Systolic CHF (HCC) 07/01/2018   Osteopenia 06/29/2018   Breast cancer screening 11/27/2014   Colon cancer screening 11/27/2014   Benign essential HTN 11/27/2014   Past Medical History:  Diagnosis Date   Arthritis    knee?   Elevated blood pressure 11/27/2014   H/O miscarriage, not currently pregnant    2   History of chicken pox    Impetigo 11/27/2014   Osteopenia 06/29/2018   dexa 06/2018. t-score of -1.6. FRAX: 1.7/10.4. Repeat 3 years.    Systolic CHF (HCC) 07/01/2018   Echo 06/2018. Started on acei/beta blocker. Referred to cards. EF 45-50%   Past Surgical History:  Procedure Laterality Date   TONSILLECTOMY  1953   Allergies  Allergen Reactions   Neomycin Anaphylaxis   Prior to Admission medications   Medication Sig Start Date End Date Taking? Authorizing Provider  aspirin EC 81 MG tablet Take 1 tablet (81 mg total) by mouth daily. 09/23/18  Yes Kathleene Hazel, MD  carvedilol (COREG) 3.125 MG tablet TAKE 1 TABLET BY MOUTH TWICE A DAY WITH MEALS 01/23/21  Yes Orland Mustard, MD  lisinopril (ZESTRIL) 10 MG tablet TAKE 1 TABLET BY MOUTH EVERY DAY 03/13/20  Yes Orland Mustard, MD   Social History   Socioeconomic History   Marital status: Married    Spouse name: Not on file   Number of children: 4  Years of education: Not on file   Highest education level: Not on file  Occupational History   Occupation: Environmental consultant aid at [employer]     Comment: teacher asst  Tobacco Use   Smoking status: Former    Packs/day: 0.50    Years: 15.00    Pack years: 7.50    Types: Cigarettes    Quit date: 11/20/1988    Years since quitting: 32.4   Smokeless tobacco: Never  Vaping Use   Vaping Use: Never used  Substance and Sexual Activity   Alcohol use: Yes    Alcohol/week: 0.0 standard drinks    Comment: occasional   Drug use: No   Sexual activity: Yes    Birth control/protection: Post-menopausal   Other Topics Concern   Not on file  Social History Narrative   [name] is from [place]. She is divorced & remarried. Lives w/her husband, 2 grandsons, & daughter. 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  Respiratory:  Negative for chest tightness and shortness of breath.   Cardiovascular:  Negative for chest pain, palpitations and leg swelling.  Gastrointestinal:  Negative for abdominal pain and blood in stool.  Neurological:  Negative for dizziness, syncope, light-headedness and headaches.  All other systems reviewed and are negative.   Objective:   Vitals:   04/17/21 0838  BP: 132/80  Pulse: 75  Resp: 18  Temp: 98.1 F (36.7 C)  TempSrc: Temporal  SpO2: 96%  Weight: 161 lb 12.8 oz (73.4 kg)  Height: 5' 5.5" (1.664 m)     Physical Exam Vitals reviewed.  Constitutional:      Appearance: Normal appearance. She is well-developed.  HENT:     Head: Normocephalic and atraumatic.  Eyes:     Conjunctiva/sclera: Conjunctivae normal.     Pupils: Pupils are equal, round, and reactive to light.  Neck:     Vascular: No carotid bruit.  Cardiovascular:     Rate and Rhythm: Normal rate and regular rhythm.     Heart sounds: Murmur (systolic 2/6) heard.  Pulmonary:     Effort: Pulmonary effort is normal.     Breath sounds: Normal breath sounds.  Abdominal:     Palpations: Abdomen is soft. There is no pulsatile mass.     Tenderness: There is no abdominal tenderness.  Musculoskeletal:     Right lower leg: No edema.     Left lower leg: No edema.  Skin:    General: Skin is warm and dry.  Neurological:     Mental Status: She is  alert and oriented to person, place, and time.  Psychiatric:        Mood and Affect: Mood normal.        Behavior: Behavior normal.     Assessment & Plan:  Erica Liu is a 75 y.o. female . Benign essential HTN - Plan: lisinopril (ZESTRIL) 10 MG tablet, carvedilol (COREG) 3.125 MG tablet, Comprehensive metabolic panel  -  Stable, tolerating current regimen. Medications refilled. Labs pending as above.   -advised to call cardiology for follow up   Colon cancer screening - Plan: Cologuard  Systolic  congestive heart failure, unspecified HF chronicity (HCC) - Plan: lisinopril (ZESTRIL) 10 MG tablet, carvedilol (COREG) 3.125 MG tablet  - euvolemic. Due for echo - plans to call cardiology for follow up .   Hyperlipidemia, unspecified hyperlipidemia type - Plan: Comprehensive metabolic panel, Lipid panel  - check labs. No current meds.   Other health maintenance as above discussed including mammogram - she will schedule.   Meds ordered this encounter  Medications   lisinopril (ZESTRIL) 10 MG tablet    Sig: TAKE 1 TABLET BY MOUTH EVERY DAY    Dispense:  90 tablet    Refill:  1   carvedilol (COREG) 3.125 MG tablet    Sig: Take 1 tablet (3.125 mg total) by mouth 2 (two) times daily with a meal.    Dispense:  180 tablet    Refill:  1   Patient Instructions  Call and schedule follow up appointment with cardiology as you appear to be due for repeat studies. No change in meds at this time. Discuss the aspirin with cardiology, as well as your meds.  No changes for now. Cologuard ordered.  Thanks for coming in today.     Signed,   Meredith Staggers, MD [facility], [facility] 04/17/21 9:26 AM

## 2021-04-17 NOTE — Patient Instructions (Addendum)
Call and schedule follow up appointment with cardiology as you appear to be due for repeat studies. No change in meds at this time. Discuss the aspirin with cardiology, as well as your meds.  No changes for now. Cologuard ordered.  Thanks for coming in today.

## 2021-06-19 ENCOUNTER — Encounter: Payer: BLUE CROSS/BLUE SHIELD | Admitting: Family Medicine

## 2021-07-04 ENCOUNTER — Telehealth: Payer: Self-pay

## 2021-07-04 ENCOUNTER — Other Ambulatory Visit: Payer: Self-pay

## 2021-07-04 DIAGNOSIS — I10 Essential (primary) hypertension: Secondary | ICD-10-CM

## 2021-07-04 DIAGNOSIS — I5020 Unspecified systolic (congestive) heart failure: Secondary | ICD-10-CM

## 2021-07-04 MED ORDER — LISINOPRIL 10 MG PO TABS: ORAL_TABLET | ORAL | 0 refills | Status: DC

## 2021-07-04 NOTE — Telephone Encounter (Signed)
Lisinopril filled and sent to patient pharmacy.

## 2021-07-04 NOTE — Telephone Encounter (Signed)
Caller name:Kendalynn Fredericka Bottcher callback [phone]  Encourage patient to contact the pharmacy for refills or they can request refills through [facility]  (Please schedule appointment if patient has not been seen in over a year)  MEDICATION NAME & DOSE:lisinopril (ZESTRIL) 10 MG tablet , carvedilol (COREG) 3.125 MG tablet   Notes/Comments from patient:Pt wants three month supply until she can get here in January   WHAT PHARMACY WOULD THEY LIKE THIS SENT TO: [id number] [place], [place] - [address]   Please notify patient: It takes 48-72 hours to process rx refill requests Ask patient to call pharmacy to ensure rx is ready before heading there.   (CLINICAL TO FILL OR ROUTE PER PROTOCOLS)

## 2021-09-18 ENCOUNTER — Encounter: Payer: BLUE CROSS/BLUE SHIELD | Admitting: Family Medicine

## 2021-09-30 ENCOUNTER — Other Ambulatory Visit: Payer: Self-pay | Admitting: Family Medicine

## 2021-09-30 DIAGNOSIS — I5020 Unspecified systolic (congestive) heart failure: Secondary | ICD-10-CM

## 2021-09-30 DIAGNOSIS — I10 Essential (primary) hypertension: Secondary | ICD-10-CM

## 2021-12-04 ENCOUNTER — Encounter: Payer: Self-pay | Admitting: Family Medicine

## 2022-01-04 ENCOUNTER — Other Ambulatory Visit: Payer: Self-pay | Admitting: Family Medicine

## 2022-01-04 DIAGNOSIS — I10 Essential (primary) hypertension: Secondary | ICD-10-CM

## 2022-01-04 DIAGNOSIS — I5020 Unspecified systolic (congestive) heart failure: Secondary | ICD-10-CM

## 2022-04-09 ENCOUNTER — Other Ambulatory Visit: Payer: Self-pay | Admitting: Family Medicine

## 2022-04-09 DIAGNOSIS — I10 Essential (primary) hypertension: Secondary | ICD-10-CM

## 2022-04-09 DIAGNOSIS — I5020 Unspecified systolic (congestive) heart failure: Secondary | ICD-10-CM

## 2022-06-24 ENCOUNTER — Other Ambulatory Visit: Payer: Self-pay

## 2022-06-24 ENCOUNTER — Other Ambulatory Visit: Payer: Self-pay | Admitting: Family Medicine

## 2022-06-24 DIAGNOSIS — I5020 Unspecified systolic (congestive) heart failure: Secondary | ICD-10-CM

## 2022-06-24 DIAGNOSIS — I10 Essential (primary) hypertension: Secondary | ICD-10-CM

## 2022-06-24 MED ORDER — CARVEDILOL 3.125 MG PO TABS: 3.1250 mg | ORAL_TABLET | Freq: Two times a day (BID) | ORAL | 1 refills | Status: DC

## 2022-09-29 ENCOUNTER — Other Ambulatory Visit: Payer: Self-pay | Admitting: Family Medicine

## 2022-09-29 DIAGNOSIS — I10 Essential (primary) hypertension: Secondary | ICD-10-CM

## 2022-09-29 DIAGNOSIS — I5020 Unspecified systolic (congestive) heart failure: Secondary | ICD-10-CM

## 2022-10-05 ENCOUNTER — Other Ambulatory Visit: Payer: Self-pay | Admitting: Family Medicine

## 2022-10-05 DIAGNOSIS — I10 Essential (primary) hypertension: Secondary | ICD-10-CM

## 2022-10-05 DIAGNOSIS — I5020 Unspecified systolic (congestive) heart failure: Secondary | ICD-10-CM

## 2023-01-01 ENCOUNTER — Other Ambulatory Visit: Payer: Self-pay

## 2023-01-01 ENCOUNTER — Other Ambulatory Visit: Payer: Self-pay | Admitting: Family Medicine

## 2023-01-01 DIAGNOSIS — I10 Essential (primary) hypertension: Secondary | ICD-10-CM

## 2023-01-01 DIAGNOSIS — I5020 Unspecified systolic (congestive) heart failure: Secondary | ICD-10-CM

## 2023-01-01 MED ORDER — CARVEDILOL 3.125 MG PO TABS: 3.1250 mg | ORAL_TABLET | Freq: Two times a day (BID) | ORAL | 0 refills | Status: DC

## 2023-01-28 ENCOUNTER — Other Ambulatory Visit: Payer: Self-pay | Admitting: Family Medicine

## 2023-01-28 DIAGNOSIS — I10 Essential (primary) hypertension: Secondary | ICD-10-CM

## 2023-01-28 DIAGNOSIS — I5020 Unspecified systolic (congestive) heart failure: Secondary | ICD-10-CM

## 2023-01-28 NOTE — Telephone Encounter (Signed)
Left pt a VM stating she needs an apt with Dr Neva Seat last apt was in 2022. This was sent in April for 60 tablets no refills and no apt has been made .

## 2023-02-01 ENCOUNTER — Other Ambulatory Visit: Payer: Self-pay | Admitting: Family Medicine

## 2023-02-01 DIAGNOSIS — I5020 Unspecified systolic (congestive) heart failure: Secondary | ICD-10-CM

## 2023-02-01 DIAGNOSIS — I10 Essential (primary) hypertension: Secondary | ICD-10-CM

## 2023-02-02 ENCOUNTER — Other Ambulatory Visit: Payer: Self-pay

## 2023-02-02 DIAGNOSIS — I10 Essential (primary) hypertension: Secondary | ICD-10-CM

## 2023-02-02 DIAGNOSIS — I5020 Unspecified systolic (congestive) heart failure: Secondary | ICD-10-CM

## 2023-02-02 MED ORDER — CARVEDILOL 3.125 MG PO TABS: 3.1250 mg | ORAL_TABLET | Freq: Two times a day (BID) | ORAL | 0 refills | Status: DC

## 2023-02-02 NOTE — Telephone Encounter (Signed)
I have left the pt a VM stating she needs an apt last OV was 04/2021. Sent in 30 day supply w/ no refills and that was stated in message as well

## 2023-02-28 ENCOUNTER — Other Ambulatory Visit: Payer: Self-pay | Admitting: Family Medicine

## 2023-02-28 DIAGNOSIS — I10 Essential (primary) hypertension: Secondary | ICD-10-CM

## 2023-02-28 DIAGNOSIS — I5020 Unspecified systolic (congestive) heart failure: Secondary | ICD-10-CM

## 2023-03-04 ENCOUNTER — Other Ambulatory Visit: Payer: Self-pay | Admitting: Family Medicine

## 2023-03-04 DIAGNOSIS — I5020 Unspecified systolic (congestive) heart failure: Secondary | ICD-10-CM

## 2023-03-04 DIAGNOSIS — I10 Essential (primary) hypertension: Secondary | ICD-10-CM

## 2023-04-01 ENCOUNTER — Other Ambulatory Visit: Payer: Self-pay | Admitting: Family Medicine

## 2023-04-01 DIAGNOSIS — I5020 Unspecified systolic (congestive) heart failure: Secondary | ICD-10-CM

## 2023-04-01 DIAGNOSIS — I10 Essential (primary) hypertension: Secondary | ICD-10-CM

## 2023-04-13 ENCOUNTER — Other Ambulatory Visit: Payer: Self-pay | Admitting: Family Medicine

## 2023-04-13 DIAGNOSIS — I10 Essential (primary) hypertension: Secondary | ICD-10-CM

## 2023-04-13 DIAGNOSIS — I5020 Unspecified systolic (congestive) heart failure: Secondary | ICD-10-CM

## 2023-05-02 ENCOUNTER — Other Ambulatory Visit: Payer: Self-pay | Admitting: Family Medicine

## 2023-05-02 DIAGNOSIS — I10 Essential (primary) hypertension: Secondary | ICD-10-CM

## 2023-05-02 DIAGNOSIS — I5020 Unspecified systolic (congestive) heart failure: Secondary | ICD-10-CM

## 2023-05-09 ENCOUNTER — Other Ambulatory Visit: Payer: Self-pay | Admitting: Family Medicine

## 2023-05-09 DIAGNOSIS — I5020 Unspecified systolic (congestive) heart failure: Secondary | ICD-10-CM

## 2023-05-09 DIAGNOSIS — I10 Essential (primary) hypertension: Secondary | ICD-10-CM

## 2023-05-11 ENCOUNTER — Other Ambulatory Visit: Payer: Self-pay

## 2023-05-11 DIAGNOSIS — I10 Essential (primary) hypertension: Secondary | ICD-10-CM

## 2023-05-11 DIAGNOSIS — I5020 Unspecified systolic (congestive) heart failure: Secondary | ICD-10-CM

## 2023-05-11 MED ORDER — CARVEDILOL 3.125 MG PO TABS
3.1250 mg | ORAL_TABLET | Freq: Two times a day (BID) | ORAL | 0 refills | Status: DC
Start: 2023-05-11 — End: 2023-06-03

## 2023-06-03 ENCOUNTER — Other Ambulatory Visit: Payer: Self-pay | Admitting: Family Medicine

## 2023-06-03 DIAGNOSIS — I10 Essential (primary) hypertension: Secondary | ICD-10-CM

## 2023-06-03 DIAGNOSIS — I5020 Unspecified systolic (congestive) heart failure: Secondary | ICD-10-CM

## 2023-10-29 ENCOUNTER — Telehealth: Payer: Self-pay | Admitting: Family Medicine

## 2023-10-29 ENCOUNTER — Telehealth: Payer: Self-pay

## 2023-10-29 NOTE — Telephone Encounter (Signed)
Called and informed the patient we cannot refill this any sooner she must be seen first. Patient reported she has had a hard time coming to see [place] due to having to adhere to her husbands schedule I apologied for the dificulty she has had getting in but restated our office policy and that it has been too long for [place] to honor a Rx patient states she may find care elsewhere

## 2023-10-29 NOTE — Telephone Encounter (Signed)
Copied from CRM [id number]. Topic: General - Other >> Oct 29, 2023 10:21 AM Elizebeth Brooking wrote: Reason for CRM: Patient called in scheduled for checkup, patient wanted to know if Dr.Greene can refill her medication  lisinopril (ZESTRIL) 10 MG tablet in the meantime while she waiting on the appointment

## 2023-10-29 NOTE — Telephone Encounter (Signed)
Per Erica Liu pt needs to be seen before this refill is sent in.

## 2023-10-29 NOTE — Telephone Encounter (Signed)
Encourage patient to contact the pharmacy for refills or they can request refills through [facility]  (Please schedule appointment if patient has not been seen in over a year)    WHAT PHARMACY WOULD THEY LIKE THIS SENT TO: [id number] [place], [place] - [address]   MEDICATION NAME & DOSE: lisinopril (ZESTRIL) 10 MG tablet   NOTES/COMMENTS FROM PATIENT: Pt sent a MyChart message stating she is almost out of this medication. She has an appt with Dr. Neva Seat on 11/19/23 for a general checkup     Front office please notify patient: It takes 48-72 hours to process rx refill requests Ask patient to call pharmacy to ensure rx is ready before heading there.

## 2023-10-29 NOTE — Telephone Encounter (Signed)
Per clinic protocol she is not able to be refilled she has already been provided a curtesy, she will need to come to the appointment before any refills will be provided

## 2023-11-19 ENCOUNTER — Ambulatory Visit: Payer: PRIVATE HEALTH INSURANCE | Admitting: Family Medicine

## 2023-11-27 ENCOUNTER — Other Ambulatory Visit: Payer: Self-pay | Admitting: Family Medicine

## 2023-11-27 DIAGNOSIS — I10 Essential (primary) hypertension: Secondary | ICD-10-CM

## 2023-11-27 DIAGNOSIS — I5020 Unspecified systolic (congestive) heart failure: Secondary | ICD-10-CM
